# Patient Record
Sex: Female | Born: 1980 | Race: Black or African American | Hispanic: No | Marital: Single | Smoking: Never smoker
Health system: Southern US, Community
[De-identification: ages and names within clinical notes are randomized; demographics above are authoritative.]

## PROBLEM LIST (undated history)

## (undated) DIAGNOSIS — G8929 Other chronic pain: Secondary | ICD-10-CM

## (undated) DIAGNOSIS — F32A Depression, unspecified: Secondary | ICD-10-CM

## (undated) DIAGNOSIS — F419 Anxiety disorder, unspecified: Secondary | ICD-10-CM

## (undated) DIAGNOSIS — R319 Hematuria, unspecified: Secondary | ICD-10-CM

## (undated) DIAGNOSIS — F988 Other specified behavioral and emotional disorders with onset usually occurring in childhood and adolescence: Secondary | ICD-10-CM

## (undated) HISTORY — PX: LAPAROSCOPIC GASTRIC SLEEVE RESECTION: SHX5895

## (undated) HISTORY — PX: OVARIAN CYST REMOVAL: SHX89

## (undated) HISTORY — PX: HERNIA REPAIR: SHX51

---

## 2005-11-10 ENCOUNTER — Encounter: Payer: Self-pay | Admitting: Family Medicine

## 2006-09-25 LAB — CONVERTED CEMR LAB: Pap Smear: NORMAL

## 2006-12-14 ENCOUNTER — Ambulatory Visit: Payer: Self-pay | Admitting: Family Medicine

## 2006-12-14 DIAGNOSIS — M5137 Other intervertebral disc degeneration, lumbosacral region: Secondary | ICD-10-CM

## 2006-12-14 DIAGNOSIS — E669 Obesity, unspecified: Secondary | ICD-10-CM | POA: Insufficient documentation

## 2006-12-14 DIAGNOSIS — I10 Essential (primary) hypertension: Secondary | ICD-10-CM | POA: Insufficient documentation

## 2006-12-18 ENCOUNTER — Ambulatory Visit: Payer: Self-pay | Admitting: Family Medicine

## 2006-12-19 ENCOUNTER — Telehealth: Payer: Self-pay | Admitting: Family Medicine

## 2007-01-03 ENCOUNTER — Telehealth (INDEPENDENT_AMBULATORY_CARE_PROVIDER_SITE_OTHER): Payer: Self-pay | Admitting: *Deleted

## 2007-01-11 ENCOUNTER — Encounter: Payer: Self-pay | Admitting: Family Medicine

## 2007-03-06 ENCOUNTER — Encounter: Payer: Self-pay | Admitting: Family Medicine

## 2007-03-07 ENCOUNTER — Telehealth (INDEPENDENT_AMBULATORY_CARE_PROVIDER_SITE_OTHER): Payer: Self-pay | Admitting: *Deleted

## 2007-10-24 ENCOUNTER — Ambulatory Visit: Payer: Self-pay | Admitting: Family Medicine

## 2007-10-24 ENCOUNTER — Other Ambulatory Visit: Admission: RE | Admit: 2007-10-24 | Discharge: 2007-10-24 | Payer: Self-pay | Admitting: Family Medicine

## 2007-10-24 ENCOUNTER — Encounter: Payer: Self-pay | Admitting: Family Medicine

## 2007-11-16 ENCOUNTER — Ambulatory Visit: Payer: Self-pay | Admitting: Family Medicine

## 2007-12-05 ENCOUNTER — Encounter: Payer: Self-pay | Admitting: Family Medicine

## 2007-12-05 LAB — CONVERTED CEMR LAB
ALT: 13 units/L (ref 0–35)
AST: 16 units/L (ref 0–37)
Alkaline Phosphatase: 58 units/L (ref 39–117)
Creatinine, Ser: 0.89 mg/dL (ref 0.40–1.20)
HCT: 38.9 % (ref 36.0–46.0)
HCV Ab: NEGATIVE
MCHC: 33.4 g/dL (ref 30.0–36.0)
MCV: 82.9 fL (ref 78.0–100.0)
Platelets: 373 10*3/uL (ref 150–400)
RDW: 15.7 % — ABNORMAL HIGH (ref 11.5–15.5)
TSH: 0.955 microintl units/mL (ref 0.350–4.50)
Total Bilirubin: 0.5 mg/dL (ref 0.3–1.2)
Total CHOL/HDL Ratio: 3
VLDL: 12 mg/dL (ref 0–40)

## 2007-12-10 ENCOUNTER — Ambulatory Visit: Payer: Self-pay | Admitting: Family Medicine

## 2007-12-13 ENCOUNTER — Telehealth: Payer: Self-pay | Admitting: Family Medicine

## 2007-12-13 ENCOUNTER — Encounter: Payer: Self-pay | Admitting: Family Medicine

## 2007-12-21 ENCOUNTER — Encounter: Payer: Self-pay | Admitting: Family Medicine

## 2008-01-28 ENCOUNTER — Ambulatory Visit: Payer: Self-pay | Admitting: Family Medicine

## 2008-01-29 ENCOUNTER — Encounter: Payer: Self-pay | Admitting: Family Medicine

## 2008-02-01 LAB — CONVERTED CEMR LAB: Anti Nuclear Antibody(ANA): NEGATIVE

## 2008-02-07 ENCOUNTER — Ambulatory Visit: Payer: Self-pay | Admitting: Family Medicine

## 2008-03-25 ENCOUNTER — Ambulatory Visit: Payer: Self-pay | Admitting: Family Medicine

## 2008-03-25 DIAGNOSIS — N390 Urinary tract infection, site not specified: Secondary | ICD-10-CM

## 2008-03-25 LAB — CONVERTED CEMR LAB
Bilirubin Urine: NEGATIVE
Blood in Urine, dipstick: NEGATIVE
Glucose, Urine, Semiquant: NEGATIVE
Specific Gravity, Urine: 1.02
pH: 6

## 2008-03-26 ENCOUNTER — Encounter: Payer: Self-pay | Admitting: Family Medicine

## 2008-04-01 ENCOUNTER — Encounter: Payer: Self-pay | Admitting: Family Medicine

## 2008-04-01 LAB — CONVERTED CEMR LAB
CO2: 24 meq/L
Calcium: 9.3 mg/dL
Chloride: 106 meq/L
Sodium: 137 meq/L

## 2008-04-14 ENCOUNTER — Encounter: Payer: Self-pay | Admitting: Family Medicine

## 2008-04-30 ENCOUNTER — Ambulatory Visit: Payer: Self-pay | Admitting: Family Medicine

## 2008-05-07 ENCOUNTER — Ambulatory Visit: Payer: Self-pay | Admitting: Family Medicine

## 2008-05-09 ENCOUNTER — Encounter: Payer: Self-pay | Admitting: Family Medicine

## 2008-06-02 ENCOUNTER — Ambulatory Visit: Payer: Self-pay | Admitting: Family Medicine

## 2008-06-02 DIAGNOSIS — J309 Allergic rhinitis, unspecified: Secondary | ICD-10-CM | POA: Insufficient documentation

## 2008-07-25 ENCOUNTER — Ambulatory Visit: Payer: Self-pay | Admitting: Family Medicine

## 2008-07-31 ENCOUNTER — Telehealth (INDEPENDENT_AMBULATORY_CARE_PROVIDER_SITE_OTHER): Payer: Self-pay | Admitting: *Deleted

## 2008-10-16 ENCOUNTER — Ambulatory Visit: Payer: Self-pay | Admitting: Family Medicine

## 2008-10-20 ENCOUNTER — Ambulatory Visit: Payer: Self-pay | Admitting: Family Medicine

## 2008-10-20 LAB — CONVERTED CEMR LAB
Bilirubin Urine: NEGATIVE
Blood in Urine, dipstick: NEGATIVE
Ketones, urine, test strip: NEGATIVE
Nitrite: NEGATIVE
Specific Gravity, Urine: 1.025
pH: 6

## 2008-10-21 ENCOUNTER — Encounter: Payer: Self-pay | Admitting: Family Medicine

## 2008-10-21 LAB — CONVERTED CEMR LAB
Clue Cells Wet Prep HPF POC: NONE SEEN
Trich, Wet Prep: NONE SEEN
Yeast Wet Prep HPF POC: NONE SEEN

## 2008-10-28 ENCOUNTER — Ambulatory Visit: Payer: Self-pay | Admitting: Family Medicine

## 2008-10-28 ENCOUNTER — Other Ambulatory Visit: Admission: RE | Admit: 2008-10-28 | Discharge: 2008-10-28 | Payer: Self-pay | Admitting: Family Medicine

## 2008-10-28 ENCOUNTER — Encounter: Payer: Self-pay | Admitting: Family Medicine

## 2008-10-28 DIAGNOSIS — E282 Polycystic ovarian syndrome: Secondary | ICD-10-CM | POA: Insufficient documentation

## 2008-11-03 ENCOUNTER — Encounter: Payer: Self-pay | Admitting: Family Medicine

## 2008-11-17 ENCOUNTER — Telehealth: Payer: Self-pay | Admitting: Family Medicine

## 2008-11-18 ENCOUNTER — Ambulatory Visit: Payer: Self-pay | Admitting: Family Medicine

## 2008-11-18 DIAGNOSIS — H8110 Benign paroxysmal vertigo, unspecified ear: Secondary | ICD-10-CM

## 2008-11-18 DIAGNOSIS — G43909 Migraine, unspecified, not intractable, without status migrainosus: Secondary | ICD-10-CM | POA: Insufficient documentation

## 2008-12-30 ENCOUNTER — Ambulatory Visit: Payer: Self-pay | Admitting: Family Medicine

## 2009-01-07 ENCOUNTER — Encounter: Payer: Self-pay | Admitting: Family Medicine

## 2009-01-12 ENCOUNTER — Ambulatory Visit: Payer: Self-pay | Admitting: Family Medicine

## 2009-03-02 ENCOUNTER — Ambulatory Visit: Payer: Self-pay | Admitting: Family Medicine

## 2009-04-07 ENCOUNTER — Ambulatory Visit: Payer: Self-pay | Admitting: Family Medicine

## 2010-02-23 NOTE — Assessment & Plan Note (Signed)
Summary: DEPO SHOT  Nurse Visit   Vitals Entered By: Payton Spark CMA (April 07, 2009 8:45 AM)  Allergies: 1)  ! Augmentin 2)  Ultram (Tramadol Hcl)  Medication Administration  Injection # 1:    Medication: Depo-Provera 150mg     Diagnosis: CONTRACEPTIVE MANAGEMENT (ICD-V25.09)    Route: IM    Site: L deltoid    Exp Date: 05/2011    Lot #: [id number]    Patient tolerated injection without complications    Given by: Payton Spark CMA (April 07, 2009 8:47 AM)  Orders Added: 1)  Admin of patients own med IM/SQ [[id number]]   Medication Administration  Injection # 1:    Medication: Depo-Provera 150mg     Diagnosis: CONTRACEPTIVE MANAGEMENT (ICD-V25.09)    Route: IM    Site: L deltoid    Exp Date: 05/2011    Lot #: [id number]    Patient tolerated injection without complications    Given by: Payton Spark CMA (April 07, 2009 8:47 AM)  Orders Added: 1)  Admin of patients own med IM/SQ [phone]

## 2010-02-23 NOTE — Letter (Signed)
Summary: [facility]  [facility]   Imported By: Lanelle Bal 02/26/2009 10:54:24  _____________________________________________________________________  External Attachment:    Type:   Image     Comment:   External Document

## 2010-10-22 DIAGNOSIS — F32A Depression, unspecified: Secondary | ICD-10-CM | POA: Insufficient documentation

## 2011-05-02 DIAGNOSIS — F419 Anxiety disorder, unspecified: Secondary | ICD-10-CM | POA: Insufficient documentation

## 2012-04-19 DIAGNOSIS — G43719 Chronic migraine without aura, intractable, without status migrainosus: Secondary | ICD-10-CM | POA: Insufficient documentation

## 2012-09-18 DIAGNOSIS — L219 Seborrheic dermatitis, unspecified: Secondary | ICD-10-CM | POA: Insufficient documentation

## 2012-10-04 DIAGNOSIS — G4733 Obstructive sleep apnea (adult) (pediatric): Secondary | ICD-10-CM | POA: Insufficient documentation

## 2012-12-06 DIAGNOSIS — F3341 Major depressive disorder, recurrent, in partial remission: Secondary | ICD-10-CM | POA: Insufficient documentation

## 2012-12-06 DIAGNOSIS — Z9884 Bariatric surgery status: Secondary | ICD-10-CM | POA: Insufficient documentation

## 2013-04-01 DIAGNOSIS — M47816 Spondylosis without myelopathy or radiculopathy, lumbar region: Secondary | ICD-10-CM | POA: Insufficient documentation

## 2013-04-01 DIAGNOSIS — G894 Chronic pain syndrome: Secondary | ICD-10-CM | POA: Insufficient documentation

## 2013-06-07 DIAGNOSIS — J302 Other seasonal allergic rhinitis: Secondary | ICD-10-CM | POA: Insufficient documentation

## 2014-06-06 DIAGNOSIS — Z30431 Encounter for routine checking of intrauterine contraceptive device: Secondary | ICD-10-CM | POA: Insufficient documentation

## 2014-08-18 DIAGNOSIS — M25562 Pain in left knee: Secondary | ICD-10-CM | POA: Insufficient documentation

## 2014-08-18 DIAGNOSIS — G8929 Other chronic pain: Secondary | ICD-10-CM | POA: Insufficient documentation

## 2014-08-18 DIAGNOSIS — M533 Sacrococcygeal disorders, not elsewhere classified: Secondary | ICD-10-CM | POA: Insufficient documentation

## 2014-09-04 DIAGNOSIS — M2241 Chondromalacia patellae, right knee: Secondary | ICD-10-CM | POA: Insufficient documentation

## 2015-01-01 DIAGNOSIS — J4530 Mild persistent asthma, uncomplicated: Secondary | ICD-10-CM | POA: Insufficient documentation

## 2015-01-01 DIAGNOSIS — J309 Allergic rhinitis, unspecified: Secondary | ICD-10-CM | POA: Insufficient documentation

## 2015-01-01 DIAGNOSIS — H1013 Acute atopic conjunctivitis, bilateral: Secondary | ICD-10-CM | POA: Insufficient documentation

## 2015-01-02 DIAGNOSIS — N390 Urinary tract infection, site not specified: Secondary | ICD-10-CM | POA: Insufficient documentation

## 2016-09-01 DIAGNOSIS — G8929 Other chronic pain: Secondary | ICD-10-CM | POA: Insufficient documentation

## 2016-09-01 DIAGNOSIS — M5442 Lumbago with sciatica, left side: Secondary | ICD-10-CM | POA: Insufficient documentation

## 2017-04-04 DIAGNOSIS — R7303 Prediabetes: Secondary | ICD-10-CM | POA: Insufficient documentation

## 2018-04-09 ENCOUNTER — Ambulatory Visit: Payer: Managed Care, Other (non HMO) | Admitting: Podiatry

## 2018-04-18 ENCOUNTER — Ambulatory Visit: Payer: Managed Care, Other (non HMO) | Admitting: Podiatry

## 2019-06-05 ENCOUNTER — Ambulatory Visit: Payer: Managed Care, Other (non HMO) | Admitting: Podiatry

## 2019-06-19 ENCOUNTER — Ambulatory Visit (INDEPENDENT_AMBULATORY_CARE_PROVIDER_SITE_OTHER): Payer: Managed Care, Other (non HMO)

## 2019-06-19 ENCOUNTER — Other Ambulatory Visit: Payer: Self-pay | Admitting: Podiatry

## 2019-06-19 ENCOUNTER — Other Ambulatory Visit: Payer: Self-pay

## 2019-06-19 ENCOUNTER — Ambulatory Visit: Payer: Managed Care, Other (non HMO) | Admitting: Podiatry

## 2019-06-19 DIAGNOSIS — M722 Plantar fascial fibromatosis: Secondary | ICD-10-CM

## 2019-06-19 DIAGNOSIS — M7731 Calcaneal spur, right foot: Secondary | ICD-10-CM | POA: Diagnosis not present

## 2019-06-19 DIAGNOSIS — M7732 Calcaneal spur, left foot: Secondary | ICD-10-CM | POA: Diagnosis not present

## 2019-06-21 NOTE — Progress Notes (Signed)
   Subjective: 39 y.o. female presenting today as a new patient, referred by Dr. Elijah Birk, with a chief complaint of burning, sharp, aching pain to the bilateral heels that began about 10 years ago. She has been diagnosed with plantar fasciitis and heel spurs in the past. She reports associated swelling. She has had injection in the past which have helped alleviate her symptoms. Walking and standing increases her pain. Patient is here for further evaluation and treatment.   No past medical history on file.   Objective: Physical Exam General: The patient is alert and oriented x3 in no acute distress.  Dermatology: Skin is warm, dry and supple bilateral lower extremities. Negative for open lesions or macerations bilateral.   Vascular: Dorsalis Pedis and Posterior Tibial pulses palpable bilateral.  Capillary fill time is immediate to all digits.  Neurological: Epicritic and protective threshold intact bilateral.   Musculoskeletal: Tenderness to palpation to the plantar aspect of the bilateral heels along the plantar fascia. All other joints range of motion within normal limits bilateral. Strength 5/5 in all groups bilateral.   Radiographic exam: Normal osseous mineralization. Joint spaces preserved. No fracture/dislocation/boney destruction. No other soft tissue abnormalities or radiopaque foreign bodies.   Assessment: 1. plantar fasciitis bilateral feet  Plan of Care:  1. Patient evaluated. Xrays reviewed.   2. Patient wants surgery in the fall/winter.  3. Recommended shoes from [employer].  4. Continue taking Celebrex for back pain as directed by PCP.  5. Return to clinic in 3 months for surgical consult.   Works at [employer] on [address] all day.   Felecia Shelling, DPM [facility]  Dr. Felecia Shelling, DPM    [address] [place], [place] [address]                Office [phone]  Fax [phone]

## 2019-09-18 ENCOUNTER — Other Ambulatory Visit: Payer: Self-pay

## 2019-09-18 ENCOUNTER — Ambulatory Visit: Payer: Managed Care, Other (non HMO) | Admitting: Podiatry

## 2019-09-18 DIAGNOSIS — M722 Plantar fascial fibromatosis: Secondary | ICD-10-CM

## 2019-09-18 NOTE — Progress Notes (Signed)
   Subjective: 39 y.o. female presenting today for follow-up evaluation and surgical consultation of plantar fasciitis to the bilateral feet is been going on for approximately 10 years now.  She has been working with Dr. Elijah Birk, local podiatrist, for the last few years and she has received all sorts of multiple conservative modalities treatments which have provided minimal relief.  She presents to discuss surgery and move forward with the plantar fasciotomy that was discussed last visit.  No past medical history on file.   Objective: Physical Exam General: The patient is alert and oriented x3 in no acute distress.  Dermatology: Skin is warm, dry and supple bilateral lower extremities. Negative for open lesions or macerations bilateral.   Vascular: Dorsalis Pedis and Posterior Tibial pulses palpable bilateral.  Capillary fill time is immediate to all digits.  Neurological: Epicritic and protective threshold intact bilateral.   Musculoskeletal: Tenderness to palpation to the plantar aspect of the bilateral heels along the plantar fascia. All other joints range of motion within normal limits bilateral. Strength 5/5 in all groups bilateral.   Assessment: 1. plantar fasciitis bilateral feet  Plan of Care:  1. Patient evaluated.  2. 2. Today we discussed the conservative versus surgical management of the presenting pathology. The patient opts for surgical management. All possible complications and details of the procedure were explained. All patient questions were answered. No guarantees were expressed or implied. 3. Authorization for surgery was initiated today. Surgery will consist of endoscopic plantar fasciotomy bilateral 4.  Continue Celebrex from PCP for back pain chronic 5.  Return to clinic 1 week postop  Works at [employer] on [address] all day.   Felecia Shelling, DPM [facility]  Dr. Felecia Shelling, DPM    [address] [place], [place]  [address]                Office [phone]  Fax [phone]

## 2019-09-20 ENCOUNTER — Telehealth: Payer: Self-pay | Admitting: Podiatry

## 2019-09-20 NOTE — Telephone Encounter (Signed)
I'm calling back to choose my sx date. I didn't make it the day of. I want to choose 10/28. You can call me back at [phone]. Thank you.

## 2019-09-20 NOTE — Telephone Encounter (Signed)
Called pt to let her know I have her scheduled for sx on 10/28. Told her she could go ahead and register online via [facility] for the sx center. Told pt that the sx center would call a day before to let her know what time her sx is. Told pt to call with any questions in the meantime.

## 2019-09-24 ENCOUNTER — Other Ambulatory Visit: Payer: Self-pay | Admitting: Physician Assistant

## 2019-09-24 ENCOUNTER — Ambulatory Visit (INDEPENDENT_AMBULATORY_CARE_PROVIDER_SITE_OTHER): Payer: Self-pay

## 2019-09-24 ENCOUNTER — Other Ambulatory Visit: Payer: Self-pay

## 2019-09-24 DIAGNOSIS — M79671 Pain in right foot: Secondary | ICD-10-CM

## 2019-10-09 ENCOUNTER — Other Ambulatory Visit: Payer: Self-pay

## 2019-10-09 ENCOUNTER — Ambulatory Visit (INDEPENDENT_AMBULATORY_CARE_PROVIDER_SITE_OTHER): Payer: Managed Care, Other (non HMO) | Admitting: Podiatry

## 2019-10-09 DIAGNOSIS — M7751 Other enthesopathy of right foot: Secondary | ICD-10-CM | POA: Diagnosis not present

## 2019-10-09 DIAGNOSIS — M722 Plantar fascial fibromatosis: Secondary | ICD-10-CM

## 2019-10-11 ENCOUNTER — Other Ambulatory Visit: Payer: Self-pay

## 2019-10-11 ENCOUNTER — Ambulatory Visit (INDEPENDENT_AMBULATORY_CARE_PROVIDER_SITE_OTHER): Payer: Managed Care, Other (non HMO) | Admitting: Podiatry

## 2019-10-11 ENCOUNTER — Ambulatory Visit (INDEPENDENT_AMBULATORY_CARE_PROVIDER_SITE_OTHER): Payer: Managed Care, Other (non HMO)

## 2019-10-11 DIAGNOSIS — M779 Enthesopathy, unspecified: Secondary | ICD-10-CM | POA: Diagnosis not present

## 2019-10-11 DIAGNOSIS — T148XXA Other injury of unspecified body region, initial encounter: Secondary | ICD-10-CM

## 2019-10-11 DIAGNOSIS — S92901A Unspecified fracture of right foot, initial encounter for closed fracture: Secondary | ICD-10-CM

## 2019-10-11 DIAGNOSIS — M79671 Pain in right foot: Secondary | ICD-10-CM

## 2019-10-11 DIAGNOSIS — S99921D Unspecified injury of right foot, subsequent encounter: Secondary | ICD-10-CM | POA: Diagnosis not present

## 2019-10-11 MED ORDER — MELOXICAM 15 MG PO TABS: 15.0000 mg | ORAL_TABLET | Freq: Every day | ORAL | 0 refills | Status: DC

## 2019-10-11 NOTE — Progress Notes (Signed)
Subjective: 39 year old female presents the office today for concerns of continued pain, swelling to her right foot. She recently just saw Dr. Logan Bores 2 days ago. She states that she is having pain and swelling. Also asking for an increase of meloxicam to 15 mg. Injury started when she was at work in a chair the changes in height in her foot jammed. Her toes did hyperextend. She denies any numbness or tingling to the toes. She presents today wearing surgical shoe. Denies any systemic complaints such as fevers, chills, nausea, vomiting. No acute changes since last appointment, and no other complaints at this time.   Objective: AAO x3, NAD DP/PT pulses palpable bilaterally, CRT less than 3 seconds There is tenderness to the third, fourth, fifth MPJs of the right foot. There is mild edema to the forefoot compared to the contralateral extremity there is no erythema or warmth. Flexor, extensor tendons appear to be intact. MMT 5/5 No pain with calf compression, swelling, warmth, erythema  Assessment: 39 year old female hyperextension injury right forefoot, rule out ligamentous injury  Plan: -All treatment options discussed with the patient including all alternatives, risks, complications.  -Due to the pain and swelling she would like to get an MRI and this is ordered for her today. Prescribe meloxicam 15 mg and discussed side effects. Prescribed elevation and remain in surgical shoe for now. -Patient encouraged to call the office with any questions, concerns, change in symptoms.   Vivi Barrack DPM

## 2019-10-13 NOTE — Progress Notes (Signed)
   Subjective: 39 y.o. female presenting today for new complaint associated to an injury that the patient sustained while at work.  This happened a couple of weeks ago.  DOI: 09/09/2019.  Patient states that she fell off a chair while she was at work and bent her foot backwards.  She went to an urgent care that day where x-rays were taken and were negative for fracture.  She continues to have some pain and throbbing in the toe.  Patient also has a chronic history of plantar fasciitis to the bilateral feet.  She has been treated by Dr. Elijah Birk, local podiatrist for the past several years.  Last visit on 09/18/2018 when she was scheduled for EPF surgery to the bilateral feet.  She currently takes Celebrex from her PCP for chronic back pain.   No past medical history on file.   Objective: Physical Exam General: The patient is alert and oriented x3 in no acute distress.  Dermatology: Skin is warm, dry and supple bilateral lower extremities. Negative for open lesions or macerations bilateral.   Vascular: Dorsalis Pedis and Posterior Tibial pulses palpable bilateral.  Capillary fill time is immediate to all digits.  Neurological: Epicritic and protective threshold intact bilateral.   Musculoskeletal: Tenderness to palpation to the plantar aspect of the bilateral heels along the plantar fascia. All other joints range of motion within normal limits bilateral. Strength 5/5 in all groups bilateral.  There is some pain on palpation range of motion of the first MTPJ of the right foot.  Likely secondary to hyper dorsiflexion injury that was sustained when the patient fell out of the chair while working.  Assessment: 1. plantar fasciitis bilateral feet 2.  First MTPJ capsulitis/hyper dorsiflexion injury right  Plan of Care:  1. Patient evaluated.  2.  Currently the patient is scheduled for EPF surgery bilateral on 10/22/2019.   3.  In regards to the injury of the right forefoot, recommend postoperative shoe.   Postoperative shoe was dispensed today.  Weightbearing as tolerated  4.  Continue Celebrex from PCP for back pain chronic 5.  Return to clinic 1 week postop  Works at [employer] on [address] all day.   Felecia Shelling, DPM [facility]  Dr. Felecia Shelling, DPM    [address] [place], [place] [address]                Office [phone]  Fax [phone]

## 2019-10-16 ENCOUNTER — Encounter: Payer: Self-pay | Admitting: Podiatry

## 2019-10-22 ENCOUNTER — Other Ambulatory Visit: Payer: 59

## 2019-10-28 ENCOUNTER — Encounter: Payer: Self-pay | Admitting: Podiatry

## 2019-10-29 ENCOUNTER — Telehealth: Payer: Self-pay

## 2019-10-29 NOTE — Telephone Encounter (Addendum)
DOS 12/26/2019  EPF B/L - 20947  [employer] EFFECTIVE DATE - 05/24/2016  PLAN DEDUCTIBLE - $750.00 W/ $750.00 MET OUT OF POCKET - $2500.00 W/ $0962.83 MET COPAY $0.00 COINSURANCE - 90%  PER AUTOMATED SYSTEM NO PRECERT REQUIRED FOR CPT [phone]. CONF # [id number]

## 2019-10-30 ENCOUNTER — Other Ambulatory Visit: Payer: Self-pay | Admitting: Podiatry

## 2019-10-30 NOTE — Progress Notes (Unsigned)
Called and scheduled peer to peer for 10:45am today to start the reconsideration process.   60630 CPT code is for the foot.

## 2019-11-01 ENCOUNTER — Other Ambulatory Visit: Payer: Self-pay | Admitting: Podiatry

## 2019-11-01 ENCOUNTER — Telehealth: Payer: Self-pay | Admitting: Podiatry

## 2019-11-01 NOTE — Telephone Encounter (Signed)
Called [employer] and the peer to peer was cancelled on Wednesday on their end.  I have a new call scheduled for Monday at 10am. It will be with Dr. Marchelle Gearing.

## 2019-11-04 ENCOUNTER — Telehealth: Payer: Self-pay | Admitting: Podiatry

## 2019-11-04 NOTE — Telephone Encounter (Signed)
Peer to peer completed and CPT code was updated to 73718 Authorization number is [id number]

## 2019-11-09 ENCOUNTER — Ambulatory Visit
Admission: RE | Admit: 2019-11-09 | Discharge: 2019-11-09 | Disposition: A | Discharge status: Home / Self Care | Payer: 59 | Source: Ambulatory Visit | Attending: Podiatry | Admitting: Podiatry

## 2019-11-09 ENCOUNTER — Other Ambulatory Visit: Payer: Self-pay

## 2019-11-09 DIAGNOSIS — T148XXA Other injury of unspecified body region, initial encounter: Secondary | ICD-10-CM

## 2019-11-12 ENCOUNTER — Encounter: Payer: Self-pay | Admitting: Podiatry

## 2019-11-14 DIAGNOSIS — F322 Major depressive disorder, single episode, severe without psychotic features: Secondary | ICD-10-CM | POA: Insufficient documentation

## 2019-11-18 ENCOUNTER — Other Ambulatory Visit: Payer: Self-pay

## 2019-11-18 ENCOUNTER — Ambulatory Visit: Payer: Managed Care, Other (non HMO) | Admitting: Podiatry

## 2019-11-18 DIAGNOSIS — M7751 Other enthesopathy of right foot: Secondary | ICD-10-CM | POA: Diagnosis not present

## 2019-11-18 DIAGNOSIS — M722 Plantar fascial fibromatosis: Secondary | ICD-10-CM

## 2019-11-18 NOTE — Progress Notes (Signed)
   HPI: 39 y.o. female presenting today for follow-up evaluation of first MTPJ capsulitis of her right foot secondary to hyper dorsiflexion injury.  DOI: 09/09/2019.  Patient sustained the injury while at work.  She states that while she is wearing the postsurgical shoe she feels some relief however as soon as she gets out of the shoe into sneakers she is having pain and tenderness.  Currently she is scheduled for EPF surgery bilateral this Thursday, 11/21/2019.  Today she is hoping to have some relief and perhaps an injection to the right forefoot to alleviate her forefoot pain.  No past medical history on file.   Physical Exam: General: The patient is alert and oriented x3 in no acute distress.  Dermatology: Skin is warm, dry and supple bilateral lower extremities. Negative for open lesions or macerations.  Vascular: Palpable pedal pulses bilaterally. No edema or erythema noted. Capillary refill within normal limits.  Neurological: Epicritic and protective threshold grossly intact bilaterally.   Musculoskeletal Exam: Range of motion within normal limits to all pedal and ankle joints bilateral. Muscle strength 5/5 in all groups bilateral.  Pain on palpation to the medial calcaneal tubercle bilateral heels.  There is also pain on palpation with range of motion of the second third MTPJ of the right forefoot  MRI impression 11/09/2019: 1. Trace fluid signal intensity between the heads of the first, second, third, and fourth metatarsals potentially representing mild intermetatarsal bursitis. 2. Low-level edema in the plantar portion of the extensor digitorum brevis muscle, query muscle strain. 3. Mild degenerative findings in the midfoot and forefoot. 4. Despite efforts by the technologist and patient, motion artifact is present on today's exam and could not be eliminated. This reduces exam sensitivity and specificity.  Assessment: 1.  Metatarsophalangeal capsulitis right forefoot 2.   Chronic plantar fasciitis bilateral   Plan of Care:  1. Patient evaluated.  MRI reviewed.  2.  Injection of 0.5 cc Celestone Soluspan injected into the second and third MTPJ right forefoot 3.  Patient scheduled for EPF surgery bilateral on 11/21/2019 4.  Patient is no longer taking Celebrex from her PCP.  Continue meloxicam as prescribed 5.  Return to clinic 1 week postop  *Works at [employer].  Patient states that she is taking 3 months off for surgery      Felecia Shelling, DPM [facility]  Dr. Felecia Shelling, DPM    [address] [place], [place] [address]                Office [phone]  Fax [phone]

## 2019-11-27 ENCOUNTER — Encounter: Payer: Managed Care, Other (non HMO) | Admitting: Podiatry

## 2019-12-04 ENCOUNTER — Other Ambulatory Visit: Payer: Self-pay | Admitting: Podiatry

## 2019-12-04 ENCOUNTER — Encounter: Payer: Managed Care, Other (non HMO) | Admitting: Podiatry

## 2019-12-04 NOTE — Telephone Encounter (Signed)
Please advise 

## 2019-12-18 ENCOUNTER — Encounter: Payer: Managed Care, Other (non HMO) | Admitting: Podiatry

## 2019-12-26 ENCOUNTER — Other Ambulatory Visit: Payer: Self-pay | Admitting: Podiatry

## 2019-12-26 DIAGNOSIS — M722 Plantar fascial fibromatosis: Secondary | ICD-10-CM | POA: Diagnosis not present

## 2019-12-26 MED ORDER — OXYCODONE-ACETAMINOPHEN 5-325 MG PO TABS: 1.0000 | ORAL_TABLET | ORAL | 0 refills | Status: DC | PRN

## 2019-12-26 NOTE — Progress Notes (Signed)
PRN postop 

## 2020-01-01 ENCOUNTER — Ambulatory Visit (INDEPENDENT_AMBULATORY_CARE_PROVIDER_SITE_OTHER): Payer: Managed Care, Other (non HMO) | Admitting: Podiatry

## 2020-01-01 ENCOUNTER — Other Ambulatory Visit: Payer: Self-pay

## 2020-01-01 DIAGNOSIS — M722 Plantar fascial fibromatosis: Secondary | ICD-10-CM

## 2020-01-01 DIAGNOSIS — M7751 Other enthesopathy of right foot: Secondary | ICD-10-CM

## 2020-01-01 DIAGNOSIS — Z9889 Other specified postprocedural states: Secondary | ICD-10-CM

## 2020-01-01 NOTE — Progress Notes (Signed)
   Subjective:  Patient presents today status post EPF bilateral. DOS: 12/27/2019.  Patient states she is doing well.  She did notice a little bit of bleeding through the bandage.  Otherwise no new complaints and the pain is somewhat tolerable.  She is weightbearing as tolerated in the postsurgical shoes.  No past medical history on file.    Objective/Physical Exam Neurovascular status intact.  Skin incisions appear to be well coapted with sutures intact. No sign of infectious process noted. No dehiscence. No active bleeding noted. Moderate edema noted to the surgical extremity.  Assessment: 1. s/p EPF bilateral. DOS: 12/26/2019   Plan of Care:  1. Patient was evaluated.  2.  Dressings changed today.  Recommend antibiotic ointment and a Band-Aid daily 3.  Ace wrap applied.  Recommend Ace wrap daily 4.  Return to clinic in 1 week for suture removal  *Works at [employer].  Patient states that she is taking 3 months off for surgery   Felecia Shelling, DPM [facility]  Dr. Felecia Shelling, DPM    [address] [place], [place] [address]                Office [phone]  Fax [phone]

## 2020-01-05 ENCOUNTER — Other Ambulatory Visit: Payer: Self-pay | Admitting: Podiatry

## 2020-01-06 NOTE — Telephone Encounter (Signed)
Please advise 

## 2020-01-08 ENCOUNTER — Ambulatory Visit (INDEPENDENT_AMBULATORY_CARE_PROVIDER_SITE_OTHER): Payer: Managed Care, Other (non HMO) | Admitting: Podiatry

## 2020-01-08 ENCOUNTER — Encounter: Payer: Self-pay | Admitting: Podiatry

## 2020-01-08 ENCOUNTER — Other Ambulatory Visit: Payer: Self-pay

## 2020-01-08 DIAGNOSIS — Z9889 Other specified postprocedural states: Secondary | ICD-10-CM

## 2020-01-08 DIAGNOSIS — M722 Plantar fascial fibromatosis: Secondary | ICD-10-CM

## 2020-01-22 NOTE — Progress Notes (Signed)
   Subjective:  Patient presents today status post EPF bilateral. DOS: 12/27/2019.  Patient states that she is doing well.  She continues to have some moderate pain throughout the day.  Alleviated by rest and elevating feet.  No new complaints at this time No past medical history on file.    Objective/Physical Exam Neurovascular status intact.  Skin incisions appear to be well coapted with sutures intact. No sign of infectious process noted. No dehiscence. No active bleeding noted. Moderate edema noted to the surgical extremity.  Assessment: 1. s/p EPF bilateral. DOS: 12/26/2019   Plan of Care:  1. Patient was evaluated.  2.  Sutures removed today. 3.  Patient may transition out of the postoperative shoes into good supportive sneakers 4.  Compression ankle sleeve dispensed.  Wear daily 5.  Return to clinic in 4 weeks  *Works at [employer].  Patient states that she is taking 3 months off for surgery   Felecia Shelling, DPM [facility]  Dr. Felecia Shelling, DPM    [address] [place], [place] [address]                Office [phone]  Fax [phone]

## 2020-01-27 ENCOUNTER — Encounter: Payer: Managed Care, Other (non HMO) | Admitting: Podiatry

## 2020-01-29 ENCOUNTER — Ambulatory Visit (INDEPENDENT_AMBULATORY_CARE_PROVIDER_SITE_OTHER): Payer: Managed Care, Other (non HMO) | Admitting: Podiatry

## 2020-01-29 ENCOUNTER — Other Ambulatory Visit: Payer: Self-pay

## 2020-01-29 DIAGNOSIS — Z9889 Other specified postprocedural states: Secondary | ICD-10-CM

## 2020-01-29 DIAGNOSIS — M722 Plantar fascial fibromatosis: Secondary | ICD-10-CM

## 2020-01-29 NOTE — Progress Notes (Signed)
   Subjective:  Patient presents today status post EPF bilateral. DOS: 12/27/2019.  Patient states that she is doing well.  Patient does have some achiness throughout the day.  Otherwise no new complaints at this time   No past medical history on file.    Objective/Physical Exam Neurovascular status intact.  Skin incisions appear to be well coapted and healed. No sign of infectious process noted. No dehiscence. No active bleeding noted.  Negative for any significant edema noted to the surgical extremity.  Assessment: 1. s/p EPF bilateral. DOS: 12/26/2019   Plan of Care:  1. Patient was evaluated.  2.  Patient may slowly increase activity daily.  Continue to refrain from work additional 2 months.  Projected return to work date 03/26/2020. 3.  Continue compression ankle sleeves and good supportive shoes daily 4.  Return to clinic 2 months for follow-up evaluation just prior to returning to work  *Works at [employer].  Patient states that she is taking 3 months off for surgery   Felecia Shelling, DPM [facility]  Dr. Felecia Shelling, DPM    [address] [place], [place] [address]                Office [phone]  Fax [phone]

## 2020-02-02 ENCOUNTER — Other Ambulatory Visit: Payer: Self-pay | Admitting: Podiatry

## 2020-02-02 NOTE — Telephone Encounter (Signed)
Please advise 

## 2020-03-06 ENCOUNTER — Other Ambulatory Visit: Payer: Self-pay | Admitting: Podiatry

## 2020-03-23 ENCOUNTER — Encounter: Payer: Self-pay | Admitting: Podiatry

## 2020-03-23 ENCOUNTER — Ambulatory Visit (INDEPENDENT_AMBULATORY_CARE_PROVIDER_SITE_OTHER): Payer: Managed Care, Other (non HMO) | Admitting: Podiatry

## 2020-03-23 ENCOUNTER — Other Ambulatory Visit: Payer: Self-pay

## 2020-03-23 DIAGNOSIS — M722 Plantar fascial fibromatosis: Secondary | ICD-10-CM

## 2020-03-23 DIAGNOSIS — Z9889 Other specified postprocedural states: Secondary | ICD-10-CM

## 2020-03-23 NOTE — Addendum Note (Signed)
Addended by: Francesca Oman on: 03/23/2020 11:57 AM   Modules accepted: Orders

## 2020-03-23 NOTE — Progress Notes (Signed)
   Subjective:  Patient presents today status post EPF bilateral. DOS: 12/27/2019.  Patient states that she is doing well.  She continues to have pain to her heels when she is on her feet for an extended period of time.  She is concerned since she is post to return to work beginning 03/26/2020.  She has been wearing good supportive shoes as instructed.   No past medical history on file.    Objective/Physical Exam Neurovascular status intact.  Skin incisions appear to be well coapted and healed. No sign of infectious process noted. No dehiscence. No active bleeding noted.  Negative for any significant edema noted to the surgical extremity.  There continues to be some achiness and tenderness palpation at the medial calcaneal tubercle bilateral  Assessment: 1. s/p EPF bilateral. DOS: 12/26/2019   Plan of Care:  1. Patient was evaluated.  2.  Overall the patient does have some improvement however she continues to have some pain and tenderness. 3.  Order placed for physical therapy at [facility] 4.  Continue wearing good supportive shoes 5.  Note for work was provided today.  Half shift only x4 weeks.  Rest as needed.  No extended periods of walking or standing 6.  Return to clinic in 6 weeks  *Works at [employer].  Patient states that she is taking 3 months off for surgery   Felecia Shelling, DPM [facility]  Dr. Felecia Shelling, DPM    [address] [place], [place] [address]                Office [phone]  Fax [phone]

## 2020-04-24 ENCOUNTER — Encounter: Payer: Self-pay | Admitting: Podiatry

## 2020-05-04 ENCOUNTER — Telehealth: Payer: Self-pay | Admitting: Podiatry

## 2020-05-04 ENCOUNTER — Other Ambulatory Visit: Payer: Self-pay

## 2020-05-04 ENCOUNTER — Ambulatory Visit (INDEPENDENT_AMBULATORY_CARE_PROVIDER_SITE_OTHER): Payer: Managed Care, Other (non HMO) | Admitting: Podiatry

## 2020-05-04 DIAGNOSIS — Z9889 Other specified postprocedural states: Secondary | ICD-10-CM

## 2020-05-04 DIAGNOSIS — M722 Plantar fascial fibromatosis: Secondary | ICD-10-CM

## 2020-05-04 MED ORDER — TRAMADOL HCL 50 MG PO TABS: 50.0000 mg | ORAL_TABLET | Freq: Four times a day (QID) | ORAL | 0 refills | Status: AC | PRN

## 2020-05-04 NOTE — Telephone Encounter (Signed)
error 

## 2020-05-04 NOTE — Telephone Encounter (Signed)
Patient called to inquire is she is ok to return for full time at work or stay part time duty. Please advise.

## 2020-05-04 NOTE — Progress Notes (Signed)
   Subjective:  Patient presents today status post EPF bilateral. DOS: 12/27/2019.  Patient states that she is doing well.  Patient states that overall there is been some improvement however she continues to have some pain specifically to the left foot.  She has been doing physical therapy 2 times per week no new complaints at this time   No past medical history on file.    Objective/Physical Exam Neurovascular status intact.  Skin incisions appear to be well coapted and healed. No sign of infectious process noted. No dehiscence. No active bleeding noted.  Negative for any significant edema noted to the surgical extremity.  There continues to be some achiness and tenderness palpation at the medial calcaneal tubercle bilateral left greater than on the right  Assessment: 1. s/p EPF bilateral. DOS: 12/26/2019   Plan of Care:  1. Patient was evaluated.  2.  Overall the patient continues to improve.  She states that the physical therapy continues to help. 3.  Continue physical therapy at [facility] 2 times per week 4.  Prescription for tramadol 50 mg every 6 hours #30 as needed intermittent pain 5.  OTC power step insoles provided.  Wear daily in her new balance shoes 6.  Return to clinic in 2 months  *Works at [employer].  Patient states that she is taking 3 months off for surgery   Felecia Shelling, DPM [facility]  Dr. Felecia Shelling, DPM    [address] [place], [place] [address]                Office [phone]  Fax [phone]

## 2020-05-11 ENCOUNTER — Encounter: Payer: Self-pay | Admitting: Podiatry

## 2020-05-26 ENCOUNTER — Telehealth: Payer: Self-pay | Admitting: *Deleted

## 2020-05-26 NOTE — Telephone Encounter (Signed)
Patient is calling with issues of B/ l feet swelling after Physical Therapy a few days ago. She tried to flatten her foot in her crocks after therapy but could not. The tramadol prescribed does not help, had to take medicine from surgery (12/27/19).  Please schedule sooner appointment for reevaluation.

## 2020-05-27 NOTE — Telephone Encounter (Signed)
Patient scheduled 06/01/20 @ 3:45.

## 2020-06-01 ENCOUNTER — Other Ambulatory Visit: Payer: Self-pay

## 2020-06-01 ENCOUNTER — Ambulatory Visit (INDEPENDENT_AMBULATORY_CARE_PROVIDER_SITE_OTHER): Payer: Managed Care, Other (non HMO) | Admitting: Podiatry

## 2020-06-01 ENCOUNTER — Encounter: Payer: Self-pay | Admitting: Podiatry

## 2020-06-01 DIAGNOSIS — M722 Plantar fascial fibromatosis: Secondary | ICD-10-CM | POA: Diagnosis not present

## 2020-06-15 ENCOUNTER — Other Ambulatory Visit: Payer: Self-pay

## 2020-06-15 ENCOUNTER — Ambulatory Visit: Payer: Managed Care, Other (non HMO) | Admitting: Podiatry

## 2020-06-15 DIAGNOSIS — L600 Ingrowing nail: Secondary | ICD-10-CM

## 2020-06-15 MED ORDER — GENTAMICIN SULFATE 0.1 % EX CREA: 1.0000 "application " | TOPICAL_CREAM | Freq: Two times a day (BID) | CUTANEOUS | 1 refills | Status: DC

## 2020-06-15 MED ORDER — DOXYCYCLINE HYCLATE 100 MG PO TABS: 100.0000 mg | ORAL_TABLET | Freq: Two times a day (BID) | ORAL | 0 refills | Status: DC

## 2020-06-15 NOTE — Progress Notes (Signed)
   Subjective: Patient presents today for evaluation of pain to the lateral border of the RT great toe. Patient is concerned for possible ingrown nail. Patient presents today for further treatment and evaluation.  No past medical history on file.  Objective:  General: Well developed, nourished, in no acute distress, alert and oriented x3   Dermatology: Skin is warm, dry and supple bilateral. Lateral border of the RT great toe appears to be erythematous with evidence of an ingrowing nail. Pain on palpation noted to the border of the nail fold. The remaining nails appear unremarkable at this time. There are no open sores, lesions.  Vascular: Dorsalis Pedis artery and Posterior Tibial artery pedal pulses palpable. No lower extremity edema noted.   Neruologic: Grossly intact via light touch bilateral.  Musculoskeletal: Muscular strength within normal limits in all groups bilateral. Normal range of motion noted to all pedal and ankle joints.   Assesement: #1 Paronychia with ingrowing nail RT great toe lateral border #2 Pain in toe #3 Incurvated nail  Plan of Care:  1. Patient evaluated.  2. Discussed treatment alternatives and plan of care. Explained nail avulsion procedure and post procedure course to patient. 3. Patient opted for conservative debridement of the offending nail border using a nail nipper 4. Rx gentamicin cream 5. Rx doxycycline 100mg  #14 6. RTC 1 week, if she still has pain and sensitivity we will proceed with partial permanent nail avulsion RT   *works at .   [employer], DPM [facility]  Dr. Felecia Shelling, DPM    [address]                                        [place], [place]                Office [phone]  Fax [phone]

## 2020-06-17 NOTE — Progress Notes (Signed)
   Subjective:  Patient presents today status post EPF bilateral. DOS: 12/27/2019.  Patient states that overall she is doing well.  She says that some days are better than others.  She continues to go to physical therapy.  She states that it helped significantly   History reviewed. No pertinent past medical history.    Objective/Physical Exam Neurovascular status intact.  Skin incisions appear to be well coapted and healed. No sign of infectious process noted. No dehiscence. No active bleeding noted.  Negative for any significant edema noted to the surgical extremity.  Minimal achiness and tenderness palpation at the medial calcaneal tubercle bilateral left greater than on the right  Assessment: 1. s/p EPF bilateral. DOS: 12/26/2019   Plan of Care:  1. Patient was evaluated.  2.  Patient may now resume full activity no restrictions beginning 07/13/2020. 3.  Continue physical therapy at [facility] until completed 4.  We will extend the patient's work restrictions to 07/13/2020 5.  Return to clinic as needed  *Works at [employer].  Patient states that she is taking 3 months off for surgery   Felecia Shelling, DPM [facility]  Dr. Felecia Shelling, DPM    [address] [place], [place] [address]                Office [phone]  Fax [phone]

## 2020-06-24 ENCOUNTER — Other Ambulatory Visit: Payer: Self-pay

## 2020-06-24 ENCOUNTER — Ambulatory Visit: Payer: Managed Care, Other (non HMO) | Admitting: Podiatry

## 2020-06-24 DIAGNOSIS — M7752 Other enthesopathy of left foot: Secondary | ICD-10-CM

## 2020-06-24 DIAGNOSIS — L600 Ingrowing nail: Secondary | ICD-10-CM | POA: Diagnosis not present

## 2020-06-24 MED ORDER — FLUCONAZOLE 150 MG PO TABS: 150.0000 mg | ORAL_TABLET | Freq: Once | ORAL | 1 refills | Status: AC

## 2020-06-25 ENCOUNTER — Telehealth: Payer: Self-pay | Admitting: Podiatry

## 2020-06-25 NOTE — Telephone Encounter (Signed)
Patient called our office stating that she was seen 06/24/20 and was offered to be put on a steroid pack and states she has changed her mind and would like she be on the steroid pack as well as  meloxicam.

## 2020-06-25 NOTE — Telephone Encounter (Signed)
Pt called back about the refill on the steroid pack and Meloxicam, please advise

## 2020-06-26 ENCOUNTER — Other Ambulatory Visit: Payer: Self-pay | Admitting: Podiatry

## 2020-06-26 ENCOUNTER — Other Ambulatory Visit: Payer: Self-pay

## 2020-06-26 ENCOUNTER — Telehealth: Payer: Self-pay | Admitting: Podiatry

## 2020-06-26 MED ORDER — MELOXICAM 15 MG PO TABS
15.0000 mg | ORAL_TABLET | Freq: Every day | ORAL | 1 refills | Status: DC
Start: 2020-06-26 — End: 2020-09-21

## 2020-06-26 MED ORDER — METHYLPREDNISOLONE 4 MG PO TBPK: ORAL_TABLET | ORAL | 0 refills | Status: DC

## 2020-06-26 NOTE — Telephone Encounter (Signed)
Patient called back wanting a prescription for Meloxicam and a steroid pack.. please advise

## 2020-06-26 NOTE — Telephone Encounter (Signed)
Pt called again wanting an update on her medication. Please advise.  

## 2020-07-03 ENCOUNTER — Other Ambulatory Visit: Payer: Self-pay

## 2020-07-03 ENCOUNTER — Encounter: Payer: Self-pay | Admitting: Emergency Medicine

## 2020-07-03 ENCOUNTER — Emergency Department
Admission: EM | Admit: 2020-07-03 | Discharge: 2020-07-03 | Disposition: A | Discharge status: Home / Self Care | Payer: Managed Care, Other (non HMO) | Source: Home / Self Care | Attending: Family Medicine | Admitting: Family Medicine

## 2020-07-03 DIAGNOSIS — N3000 Acute cystitis without hematuria: Secondary | ICD-10-CM | POA: Diagnosis not present

## 2020-07-03 DIAGNOSIS — R300 Dysuria: Secondary | ICD-10-CM

## 2020-07-03 HISTORY — DX: Other chronic pain: G89.29

## 2020-07-03 HISTORY — DX: Other specified behavioral and emotional disorders with onset usually occurring in childhood and adolescence: F98.8

## 2020-07-03 LAB — POCT URINALYSIS DIP (MANUAL ENTRY)
Bilirubin, UA: NEGATIVE
Bilirubin, UA: NEGATIVE
Blood, UA: NEGATIVE
Blood, UA: NEGATIVE
Glucose, UA: NEGATIVE mg/dL
Glucose, UA: NEGATIVE mg/dL
Ketones, POC UA: NEGATIVE mg/dL
Ketones, POC UA: NEGATIVE mg/dL
Nitrite, UA: POSITIVE — AB
Nitrite, UA: POSITIVE — AB
Spec Grav, UA: >=1.03 — AB (ref 1.010–1.025)
Spec Grav, UA: >=1.03 — AB (ref 1.010–1.025)
Urobilinogen, UA: 0.2 E.U./dL
Urobilinogen, UA: 0.2 E.U./dL
pH, UA: 6 (ref 5.0–8.0)
pH, UA: 6 (ref 5.0–8.0)

## 2020-07-03 MED ORDER — SULFAMETHOXAZOLE-TRIMETHOPRIM 800-160 MG PO TABS: 1.0000 | ORAL_TABLET | Freq: Two times a day (BID) | ORAL | 0 refills | Status: DC

## 2020-07-03 MED ORDER — ONDANSETRON 4 MG PO TBDP
4.0000 mg | ORAL_TABLET | Freq: Once | ORAL | Status: AC
  Administered 2020-07-03: 4 mg via ORAL

## 2020-07-03 MED ORDER — FLUCONAZOLE 150 MG PO TABS: ORAL_TABLET | ORAL | 0 refills | Status: DC

## 2020-07-03 NOTE — ED Provider Notes (Signed)
[facility]    CSN: [id number] Arrival date & time: 07/03/20  1728      History   Chief Complaint Chief Complaint  Patient presents with   Dysuria    HPI [facility] is a 40 y.o. female.   Patient noticed malodorous urine about a week ago, and has gradually developed dysuria and frequency.  She denies abdominal/pelvic pain and fever.  No LMP recorded. Patient has had an DepoProvera.   The history is provided by the patient.  Dysuria Pain quality:  Burning Pain severity:  Mild Onset quality:  Gradual Duration:  1 week Timing:  Constant Progression:  Worsening Chronicity:  New Recent urinary tract infections: no   Relieved by:  Nothing Worsened by:  Nothing Ineffective treatments:  None tried Urinary symptoms: foul-smelling urine, frequent urination and hesitancy   Urinary symptoms: no discolored urine, no hematuria and no bladder incontinence   Associated symptoms: no abdominal pain, no fever, no flank pain, no genital lesions, no nausea, no vaginal discharge and no vomiting    Past Medical History:  Diagnosis Date   ADD (attention deficit disorder)    Chronic pain     Patient Active Problem List   Diagnosis Date Noted   MIGRAINE HEADACHE 11/18/2008   BENIGN POSITIONAL VERTIGO 11/18/2008   POLYCYSTIC OVARIES 10/28/2008   ALLERGIC RHINITIS CAUSE UNSPECIFIED 06/02/2008   URINARY TRACT INFECTION, RECURRENT 03/25/2008   OBESITY, UNSPECIFIED 12/14/2006   ESSENTIAL HYPERTENSION, BENIGN 12/14/2006   DISC DISEASE, LUMBAR 12/14/2006    History reviewed. No pertinent surgical history.  OB History   No obstetric history on file.      Home Medications    Prior to Admission medications   Medication Sig Start Date End Date Taking? Authorizing Provider  sulfamethoxazole-trimethoprim (BACTRIM DS) 800-160 MG tablet Take 1 tablet by mouth 2 (two) times daily. 07/03/20  Yes Lattie Haw, MD  acetaminophen (TYLENOL) 500 MG tablet Take by mouth.  12/06/18   [provider]  albuterol (VENTOLIN HFA) 108 (90 Base) MCG/ACT inhaler Inhale into the lungs. 07/21/13   [provider]  azelastine (ASTELIN) 0.1 % nasal spray SMARTSIG:2 Spray(s) Both Nares Twice Daily PRN 06/15/19   [provider]  azelastine (ASTELIN) 0.1 % nasal spray 2 sprays 2 (two) times daily. 07/13/19   [provider]  buprenorphine (BUTRANS) 7.5 MCG/HR 1 patch once a week. 06/02/20   [provider]  buPROPion (WELLBUTRIN XL) 300 MG 24 hr tablet Take 300 mg by mouth daily. 06/16/19   [provider]  celecoxib (CELEBREX) 200 MG capsule Take by mouth 2 (two) times daily. 06/14/19   [provider]  desvenlafaxine (PRISTIQ) 100 MG 24 hr tablet Take 100 mg by mouth daily. 03/13/19   [provider]  doxycycline (VIBRA-TABS) 100 MG tablet Take 1 tablet (100 mg total) by mouth 2 (two) times daily. 06/15/20   Felecia Shelling, DPM  fluconazole (DIFLUCAN) 150 MG tablet TAKE ONE TABLET (150 MG DOSE) BY MOUTH ONCE FOR 1 DOSE. REPEAT IN 72 HOURS IF NEEDED 07/03/20   Lattie Haw, MD  fluticasone Aleda Grana) 50 MCG/ACT nasal spray  04/18/19   [provider]  gentamicin cream (GARAMYCIN) 0.1 % Apply 1 application topically 2 (two) times daily. 06/15/20   Felecia Shelling, DPM  ibuprofen (ADVIL) 800 MG tablet Take 1 tablet every 8 hours as needed for pain 12/06/18   [provider]  ipratropium (ATROVENT) 0.03 % nasal spray  04/18/19  [provider]  lisdexamfetamine (VYVANSE) 30 MG capsule Take by mouth. 07/08/19   [provider]  lisinopril (ZESTRIL) 40 MG tablet Take 40 mg by mouth daily. 04/22/19   [provider]  lisinopril (ZESTRIL) 40 MG tablet SMARTSIG:1 Tablet(s) By Mouth Daily 07/12/19   [provider]  medroxyPROGESTERone (DEPO-PROVERA) 150 MG/ML injection Inject into the muscle. 02/16/17   [provider]  meloxicam (MOBIC) 15 MG tablet Take 1 tablet (15  mg total) by mouth daily. 06/26/20   Felecia Shelling, DPM  methylPREDNISolone (MEDROL DOSEPAK) 4 MG TBPK tablet 6 day dose pack - take as directed 06/26/20   Felecia Shelling, DPM  metoprolol tartrate (LOPRESSOR) 25 MG tablet Take 25 mg by mouth 2 (two) times daily. 02/13/20   [provider]  mirtazapine (REMERON SOL-TAB) 15 MG disintegrating tablet Take by mouth. 11/28/18   [provider]  montelukast (SINGULAIR) 10 MG tablet Take 10 mg by mouth daily. 04/22/19   [provider]  nadolol (CORGARD) 80 MG tablet TAKE ONE TABLET BY MOUTH DAILY 04/16/19   [provider]  naloxone (NARCAN) nasal spray 4 mg/0.1 mL Place into the nose. 08/13/19   [provider]  OXcarbazepine (TRILEPTAL) 150 MG tablet Take 150 mg by mouth at bedtime. 06/11/19   [provider]  oxyCODONE-acetaminophen (PERCOCET) 5-325 MG tablet Take 1 tablet by mouth every 4 (four) hours as needed for severe pain. 12/26/19   Felecia Shelling, DPM  pregabalin (LYRICA) 200 MG capsule Take 200 mg by mouth 3 (three) times daily. 06/14/19   [provider]  REXULTI 0.5 MG TABS Take 1 tablet by mouth daily. 03/13/19   [provider]  tizanidine (ZANAFLEX) 2 MG capsule  06/14/19   [provider]  traZODone (DESYREL) 50 MG tablet TAKE 1 TABLET BY MOUTH  NIGHTLY AS NEEDED FOR SLEEP 09/26/17   [provider]  Vilazodone HCl (VIIBRYD) 20 MG TABS Take by mouth. 07/25/18   [provider]  VYVANSE 20 MG capsule Take 20 mg by mouth every morning. 06/06/19   [provider]    Family History No family history on file.  Social History     Allergies   Dust mite extract, Molds & smuts, Other, Quercus robur, Tramadol, Amoxicillin-pot clavulanate, Tramadol hcl, Bee pollen, and Pollen extract   Review of Systems Review of Systems  Constitutional:  Negative for activity change, appetite change, chills, diaphoresis, fatigue and fever.  Gastrointestinal:   Negative for abdominal distention, abdominal pain, nausea and vomiting.  Genitourinary:  Positive for dysuria, frequency and urgency. Negative for flank pain, hematuria, menstrual problem, pelvic pain, vaginal bleeding and vaginal discharge.  Neurological:  Negative for headaches.  Hematological:  Negative for adenopathy.  All other systems reviewed and are negative.   Physical Exam Triage Vital Signs ED Triage Vitals  Enc Vitals Group     BP 07/03/20 1825 131/81     Pulse Rate 07/03/20 1825 68     Resp 07/03/20 1825 16     Temp 07/03/20 1825 99 F (37.2 C)     Temp Source 07/03/20 1825 Oral     SpO2 07/03/20 1825 99 %     Weight 07/03/20 1811 (!) 320 lb (145.2 kg)     Height 07/03/20 1811 5' 11.5" (1.816 m)     Head Circumference --      Peak Flow --      Pain Score 07/03/20 1810 5     Pain  Loc --      Pain Edu? --      Excl. in GC? --    No data found.  Updated Vital Signs BP 131/81 (BP Location: Right Arm)   Pulse 68   Temp 99 F (37.2 C) (Oral)   Resp 16   Ht 5' 11.5" (1.816 m)   Wt (!) 145.2 kg   SpO2 99%   BMI 44.01 kg/m   Visual Acuity Right Eye Distance:   Left Eye Distance:   Bilateral Distance:    Right Eye Near:   Left Eye Near:    Bilateral Near:     Physical Exam   UC Treatments / Results  Labs (all labs ordered are listed, but only abnormal results are displayed) Labs Reviewed  POCT URINALYSIS DIP (MANUAL ENTRY) - Abnormal; Notable for the following components:      Result Value   Clarity, UA cloudy (*)    Spec Grav, UA >=1.030 (*)    Protein Ur, POC trace (*)    Nitrite, UA Positive (*)    Leukocytes, UA Small (1+) (*)    All other components within normal limits  POCT URINALYSIS DIP (MANUAL ENTRY) - Abnormal; Notable for the following components:   Clarity, UA cloudy (*)    Spec Grav, UA >=1.030 (*)    Protein Ur, POC trace (*)    Nitrite, UA Positive (*)    Leukocytes, UA Small (1+) (*)    All other components within normal  limits  URINE CULTURE    EKG   Radiology No results found.  Procedures Procedures (including critical care time)  Medications Ordered in UC Medications - No data to display  Initial Impression / Assessment and Plan / UC Course  I have reviewed the triage vital signs and the nursing notes.  Pertinent labs & imaging results that were available during my care of the patient were reviewed by me and considered in my medical decision making (see chart for details).    Urine culture pending. Begin Bactrim DS.  Rx for Diflucan at patient's request. Followup with Family Doctor if not improved in one week.   Final Clinical Impressions(s) / UC Diagnoses   Final diagnoses:  Dysuria  Acute cystitis without hematuria     Discharge Instructions      Increase fluid intake. May use non-prescription AZO for about two days, if desired, to decrease urinary discomfort.   If symptoms become significantly worse during the night or over the weekend, proceed to the local emergency room.      ED Prescriptions     Medication Sig Dispense Auth. Provider   fluconazole (DIFLUCAN) 150 MG tablet TAKE ONE TABLET (150 MG DOSE) BY MOUTH ONCE FOR 1 DOSE. REPEAT IN 72 HOURS IF NEEDED 2 tablet Lattie Haw, MD   sulfamethoxazole-trimethoprim (BACTRIM DS) 800-160 MG tablet Take 1 tablet by mouth 2 (two) times daily. 14 tablet Lattie Haw, MD         Lattie Haw, MD 07/05/20 [phone]

## 2020-07-03 NOTE — Discharge Instructions (Addendum)
Increase fluid intake. May use non-prescription AZO for about two days, if desired, to decrease urinary discomfort.  If symptoms become significantly worse during the night or over the weekend, proceed to the local emergency room.  

## 2020-07-03 NOTE — ED Triage Notes (Signed)
Patient has had dysuria for about a week; sees a urologist and has not had follow up from initial evaluation; takes vesicare daily.  Has had all covid vaccinations.

## 2020-07-04 ENCOUNTER — Telehealth: Payer: Self-pay | Admitting: Family Medicine

## 2020-07-04 ENCOUNTER — Telehealth: Payer: Self-pay | Admitting: Emergency Medicine

## 2020-07-04 MED ORDER — NITROFURANTOIN MONOHYD MACRO 100 MG PO CAPS: 100.0000 mg | ORAL_CAPSULE | Freq: Two times a day (BID) | ORAL | 0 refills | Status: DC

## 2020-07-04 NOTE — Telephone Encounter (Signed)
Call from pt - message left w/ pt access - [facility] states she has a rash with the antibiotic prescribed yesterday - requesting a different one. Provider here today updated - awaiting feed back

## 2020-07-04 NOTE — Telephone Encounter (Signed)
Vies patient to discontinue Bactrim now and start Macrobid thank you.

## 2020-07-05 DIAGNOSIS — M7752 Other enthesopathy of left foot: Secondary | ICD-10-CM | POA: Diagnosis not present

## 2020-07-05 LAB — URINE CULTURE
MICRO NUMBER:: 11995335
SPECIMEN QUALITY:: ADEQUATE

## 2020-07-05 MED ORDER — BETAMETHASONE SOD PHOS & ACET 6 (3-3) MG/ML IJ SUSP
3.0000 mg | Freq: Once | INTRAMUSCULAR | Status: AC
  Administered 2020-07-05: 3 mg via INTRA_ARTICULAR

## 2020-07-05 NOTE — Progress Notes (Signed)
   HPI: 40 y.o. female presenting today for follow-up evaluation of an ingrown toenail to the lateral border of the right great toe.  Last visit on 06/15/2020 she opted for conservative debridement of the offending nail border using a nail nipper.  Gentamicin cream and doxycycline was prescribed.  She states that she felt much better.  Patient has a new complaint today regarding pain and tenderness to the dorsal aspect of the left foot.  Patient's began having pain after a physical therapy session last week.  It is very painful to bend her great toe and to walk.  She sometimes experiences some swelling.  She does have a history of endoscopic plantar fasciotomy bilateral  Past Medical History:  Diagnosis Date   ADD (attention deficit disorder)    Chronic pain      Physical Exam: General: The patient is alert and oriented x3 in no acute distress.  Dermatology: Skin is warm, dry and supple bilateral lower extremities. Negative for open lesions or macerations.  Negative for any significant tenderness to palpation to the lateral border of the right great toenail  Vascular: Palpable pedal pulses bilaterally. No edema or erythema noted. Capillary refill within normal limits.  Neurological: Epicritic and protective threshold grossly intact bilaterally.   Musculoskeletal Exam: Range of motion within normal limits to all pedal and ankle joints bilateral. Muscle strength 5/5 in all groups bilateral.  Pain on palpation along the extensor hallucis longus tendon left   Assessment: 1.  EHL tendinitis left 2.  Ingrown toenail right hallux lateral border; mostly resolved 3.  H/o Endoscopic Plantar Fasciotomy B/L. DOS: 12/26/2019   Plan of Care:  1. Patient evaluated. 2.  Injection of 0.5 cc Celestone Soluspan injected along the extensor hallucis longus tendon sheath left 3.  Resume cam boot left foot x2 weeks.  Weightbearing as tolerated 4.  Return to clinic in 4 weeks  *Works for [employer]       Felecia Shelling, DPM [facility]  Dr. Felecia Shelling, DPM    [address] [place], [place] [address]                Office [phone]  Fax [phone]

## 2020-07-06 ENCOUNTER — Emergency Department
Admission: EM | Admit: 2020-07-06 | Discharge: 2020-07-06 | Disposition: A | Discharge status: Other Acute Inpatient Hospital | Payer: Managed Care, Other (non HMO) | Source: Home / Self Care

## 2020-07-06 ENCOUNTER — Encounter: Payer: Self-pay | Admitting: Emergency Medicine

## 2020-07-06 ENCOUNTER — Ambulatory Visit: Payer: Managed Care, Other (non HMO) | Admitting: Podiatry

## 2020-07-06 ENCOUNTER — Other Ambulatory Visit: Payer: Self-pay

## 2020-07-06 DIAGNOSIS — R200 Anesthesia of skin: Secondary | ICD-10-CM | POA: Diagnosis not present

## 2020-07-06 DIAGNOSIS — H538 Other visual disturbances: Secondary | ICD-10-CM

## 2020-07-06 DIAGNOSIS — S0990XA Unspecified injury of head, initial encounter: Secondary | ICD-10-CM | POA: Diagnosis not present

## 2020-07-06 NOTE — Discharge Instructions (Addendum)
Go to the ER for further evaluation and treatment. 

## 2020-07-06 NOTE — ED Triage Notes (Signed)
Patient hit head on window sash about 4 hours ago; no bleeding but feels that left side of face aches and feels edematous under left eye. No LOC.

## 2020-07-13 NOTE — ED Provider Notes (Signed)
[facility]    CSN: [id number] Arrival date & time: 07/06/20  2020      History   Chief Complaint Chief Complaint  Patient presents with   Head Injury    HPI [facility] is a 40 y.o. female.   Reports that she hit her head on the corner of a cabinet earlier today. Reports dizziness, left jaw pain, left facial numbness, headache, and blurred vision. Denies previous symptoms. Denies OTC treatment. Denies loss of consciousness, loss of vision, changes in taste or smell, other symptoms.  ROS per HPI  The history is provided by the patient.  Head Injury  Past Medical History:  Diagnosis Date   ADD (attention deficit disorder)    Chronic pain     Patient Active Problem List   Diagnosis Date Noted   MIGRAINE HEADACHE 11/18/2008   BENIGN POSITIONAL VERTIGO 11/18/2008   POLYCYSTIC OVARIES 10/28/2008   ALLERGIC RHINITIS CAUSE UNSPECIFIED 06/02/2008   URINARY TRACT INFECTION, RECURRENT 03/25/2008   OBESITY, UNSPECIFIED 12/14/2006   ESSENTIAL HYPERTENSION, BENIGN 12/14/2006   DISC DISEASE, LUMBAR 12/14/2006    History reviewed. No pertinent surgical history.  OB History   No obstetric history on file.      Home Medications    Prior to Admission medications   Medication Sig Start Date End Date Taking? Authorizing Provider  acetaminophen (TYLENOL) 500 MG tablet Take by mouth. 12/06/18   [provider]  albuterol (VENTOLIN HFA) 108 (90 Base) MCG/ACT inhaler Inhale into the lungs. 07/21/13   [provider]  azelastine (ASTELIN) 0.1 % nasal spray SMARTSIG:2 Spray(s) Both Nares Twice Daily PRN 06/15/19   [provider]  azelastine (ASTELIN) 0.1 % nasal spray 2 sprays 2 (two) times daily. 07/13/19   [provider]  buprenorphine (BUTRANS) 7.5 MCG/HR 1 patch once a week. 06/02/20   [provider]  buPROPion (WELLBUTRIN XL) 300 MG 24 hr tablet Take 300 mg by mouth daily. 06/16/19   [provider]  celecoxib  (CELEBREX) 200 MG capsule Take by mouth 2 (two) times daily. 06/14/19   [provider]  desvenlafaxine (PRISTIQ) 100 MG 24 hr tablet Take 100 mg by mouth daily. 03/13/19   [provider]  doxycycline (VIBRA-TABS) 100 MG tablet Take 1 tablet (100 mg total) by mouth 2 (two) times daily. 06/15/20   Felecia Shelling, DPM  fluconazole (DIFLUCAN) 150 MG tablet TAKE ONE TABLET (150 MG DOSE) BY MOUTH ONCE FOR 1 DOSE. REPEAT IN 72 HOURS IF NEEDED 07/03/20   Lattie Haw, MD  fluticasone Aleda Grana) 50 MCG/ACT nasal spray  04/18/19   [provider]  gentamicin cream (GARAMYCIN) 0.1 % Apply 1 application topically 2 (two) times daily. 06/15/20   Felecia Shelling, DPM  ibuprofen (ADVIL) 800 MG tablet Take 1 tablet every 8 hours as needed for pain 12/06/18   [provider]  ipratropium (ATROVENT) 0.03 % nasal spray  04/18/19   [provider]  lisdexamfetamine (VYVANSE) 30 MG capsule Take by mouth. 07/08/19   [provider]  lisinopril (ZESTRIL) 40 MG tablet Take 40 mg by mouth daily. 04/22/19   [provider]  lisinopril (ZESTRIL) 40 MG tablet SMARTSIG:1 Tablet(s) By Mouth Daily 07/12/19   [provider]  medroxyPROGESTERone (DEPO-PROVERA) 150 MG/ML injection Inject into the muscle. 02/16/17   [provider]  meloxicam (MOBIC) 15 MG tablet Take 1 tablet (15 mg total) by mouth daily. 06/26/20   Felecia Shelling, DPM  methylPREDNISolone (MEDROL  DOSEPAK) 4 MG TBPK tablet 6 day dose pack - take as directed 06/26/20   Felecia Shelling, DPM  metoprolol tartrate (LOPRESSOR) 25 MG tablet Take 25 mg by mouth 2 (two) times daily. 02/13/20   [provider]  mirtazapine (REMERON SOL-TAB) 15 MG disintegrating tablet Take by mouth. 11/28/18   [provider]  montelukast (SINGULAIR) 10 MG tablet Take 10 mg by mouth daily. 04/22/19   [provider]  nadolol (CORGARD) 80 MG tablet TAKE ONE TABLET BY MOUTH DAILY 04/16/19   [provider]  naloxone (NARCAN) nasal spray 4 mg/0.1 mL Place into the nose. 08/13/19   [provider]  nitrofurantoin, macrocrystal-monohydrate, (MACROBID) 100 MG capsule Take 1 capsule (100 mg total) by mouth 2 (two) times daily. 07/04/20   Trevor Iha, FNP  OXcarbazepine (TRILEPTAL) 150 MG tablet Take 150 mg by mouth at bedtime. 06/11/19   [provider]  oxyCODONE-acetaminophen (PERCOCET) 5-325 MG tablet Take 1 tablet by mouth every 4 (four) hours as needed for severe pain. 12/26/19   Felecia Shelling, DPM  pregabalin (LYRICA) 200 MG capsule Take 200 mg by mouth 3 (three) times daily. 06/14/19   [provider]  REXULTI 0.5 MG TABS Take 1 tablet by mouth daily. 03/13/19   [provider]  sulfamethoxazole-trimethoprim (BACTRIM DS) 800-160 MG tablet Take 1 tablet by mouth 2 (two) times daily. 07/03/20   Lattie Haw, MD  tizanidine (ZANAFLEX) 2 MG capsule  06/14/19   [provider]  traZODone (DESYREL) 50 MG tablet TAKE 1 TABLET BY MOUTH  NIGHTLY AS NEEDED FOR SLEEP 09/26/17   [provider]  Vilazodone HCl (VIIBRYD) 20 MG TABS Take by mouth. 07/25/18   [provider]  VYVANSE 20 MG capsule Take 20 mg by mouth every morning. 06/06/19   [provider]    Family History No family history on file.  Social History     Allergies   Dust mite extract, Molds & smuts, Other, Quercus robur, Tramadol, Amoxicillin-pot clavulanate, Tramadol hcl, Bee pollen, and Pollen extract   Review of Systems Review of Systems   Physical Exam Triage Vital Signs ED Triage Vitals  Enc Vitals Group     BP 07/06/20 2036 (!) 150/84     Pulse Rate 07/06/20 2036 64     Resp 07/06/20 2036 16     Temp 07/06/20 2036 98.8 F (37.1 C)     Temp Source 07/06/20 2036 Oral     SpO2 07/06/20 2036 99 %     Weight 07/06/20 2039 (!) 319 lb 10.7 oz (145 kg)     Height 07/06/20 2039 5' 11.5" (1.816 m)     Head Circumference --      Peak Flow --       Pain Score 07/06/20 2039 6     Pain Loc --      Pain Edu? --      Excl. in GC? --    No data found.  Updated Vital Signs BP (!) 150/84 (BP Location: Right Wrist)   Pulse 64   Temp 98.8 F (37.1 C) (Oral)   Resp 16   Ht 5' 11.5" (1.816 m)   Wt (!) 319 lb 10.7 oz (145 kg)   SpO2 99%   BMI 43.96 kg/m   Visual Acuity Right Eye Distance:   Left Eye Distance:   Bilateral Distance:    Right Eye Near:   Left Eye Near:    Bilateral Near:  Physical Exam Vitals and nursing note reviewed.  Constitutional:      General: She is not in acute distress.    Appearance: Normal appearance. She is well-developed.  HENT:     Head: Normocephalic and atraumatic.     Nose: Nose normal.     Mouth/Throat:     Mouth: Mucous membranes are moist.     Pharynx: Oropharynx is clear.  Eyes:     Extraocular Movements: Extraocular movements intact.     Conjunctiva/sclera: Conjunctivae normal.     Pupils: Pupils are equal, round, and reactive to light.  Cardiovascular:     Rate and Rhythm: Normal rate and regular rhythm.  Pulmonary:     Effort: Pulmonary effort is normal. No respiratory distress.  Musculoskeletal:        General: Normal range of motion.     Cervical back: Normal range of motion and neck supple.  Skin:    General: Skin is warm and dry.     Capillary Refill: Capillary refill takes less than 2 seconds.  Neurological:     General: No focal deficit present.     Mental Status: She is alert and oriented to person, place, and time.  Psychiatric:        Mood and Affect: Mood normal.        Behavior: Behavior normal.        Thought Content: Thought content normal.     UC Treatments / Results  Labs (all labs ordered are listed, but only abnormal results are displayed) Labs Reviewed - No data to display  EKG   Radiology No results found.  Procedures Procedures (including critical care time)  Medications Ordered in UC Medications - No data to display  Initial  Impression / Assessment and Plan / UC Course  I have reviewed the triage vital signs and the nursing notes.  Pertinent labs & imaging results that were available during my care of the patient were reviewed by me and considered in my medical decision making (see chart for details).    Head Injury Left Facial Numbness Blurred Vision  Discussed that given her symptoms and recent head injury and symptoms, that she would be best served in the ER but that she should not drive herself Verbalized understanding and agrees with treatment plan To ER via POV  Final Clinical Impressions(s) / UC Diagnoses   Final diagnoses:  Injury of head, initial encounter  Left facial numbness  Blurred vision, left eye     Discharge Instructions      Go to the ER for further evaluation and treatment      ED Prescriptions   None    PDMP not reviewed this encounter.   Moshe Cipro, NP 07/13/20 1203

## 2020-07-14 ENCOUNTER — Other Ambulatory Visit: Payer: Self-pay

## 2020-07-14 ENCOUNTER — Encounter: Payer: Self-pay | Admitting: Podiatry

## 2020-07-14 ENCOUNTER — Ambulatory Visit: Payer: Managed Care, Other (non HMO) | Admitting: Podiatry

## 2020-07-14 DIAGNOSIS — M4800 Spinal stenosis, site unspecified: Secondary | ICD-10-CM | POA: Insufficient documentation

## 2020-07-14 DIAGNOSIS — L600 Ingrowing nail: Secondary | ICD-10-CM

## 2020-07-14 DIAGNOSIS — M778 Other enthesopathies, not elsewhere classified: Secondary | ICD-10-CM

## 2020-07-14 DIAGNOSIS — N3281 Overactive bladder: Secondary | ICD-10-CM | POA: Insufficient documentation

## 2020-07-14 NOTE — Patient Instructions (Signed)

## 2020-07-15 ENCOUNTER — Emergency Department
Admission: EM | Admit: 2020-07-15 | Discharge: 2020-07-15 | Disposition: A | Discharge status: Home / Self Care | Payer: Managed Care, Other (non HMO) | Source: Home / Self Care

## 2020-07-15 ENCOUNTER — Other Ambulatory Visit: Payer: Self-pay

## 2020-07-15 ENCOUNTER — Other Ambulatory Visit (HOSPITAL_COMMUNITY)
Admission: RE | Admit: 2020-07-15 | Discharge: 2020-07-15 | Disposition: A | Discharge status: Home / Self Care | Payer: Managed Care, Other (non HMO) | Source: Ambulatory Visit | Attending: Physician Assistant | Admitting: Physician Assistant

## 2020-07-15 DIAGNOSIS — R1030 Lower abdominal pain, unspecified: Secondary | ICD-10-CM | POA: Insufficient documentation

## 2020-07-15 DIAGNOSIS — N3000 Acute cystitis without hematuria: Secondary | ICD-10-CM | POA: Insufficient documentation

## 2020-07-15 DIAGNOSIS — Z87440 Personal history of urinary (tract) infections: Secondary | ICD-10-CM | POA: Diagnosis not present

## 2020-07-15 DIAGNOSIS — R300 Dysuria: Secondary | ICD-10-CM | POA: Diagnosis not present

## 2020-07-15 DIAGNOSIS — Z882 Allergy status to sulfonamides status: Secondary | ICD-10-CM | POA: Insufficient documentation

## 2020-07-15 HISTORY — DX: Depression, unspecified: F32.A

## 2020-07-15 HISTORY — DX: Anxiety disorder, unspecified: F41.9

## 2020-07-15 LAB — POCT URINALYSIS DIP (MANUAL ENTRY)
Bilirubin, UA: NEGATIVE
Blood, UA: NEGATIVE
Glucose, UA: NEGATIVE mg/dL
Ketones, POC UA: NEGATIVE mg/dL
Leukocytes, UA: NEGATIVE
Nitrite, UA: NEGATIVE
Spec Grav, UA: >=1.03 — AB (ref 1.010–1.025)
Urobilinogen, UA: 0.2 E.U./dL
pH, UA: 5.5 (ref 5.0–8.0)

## 2020-07-15 MED ORDER — CIPROFLOXACIN HCL 250 MG PO TABS: 250.0000 mg | ORAL_TABLET | Freq: Two times a day (BID) | ORAL | 0 refills | Status: AC

## 2020-07-15 NOTE — ED Triage Notes (Signed)
Pt presents to Urgent Care with c/o continued s/s of UTI that was dx and treated recently. Pt reports doing better after beginning Macrobid, but urinary frequency and lower abdominal/back pain returned toward end of course approx 5-6 days ago.

## 2020-07-15 NOTE — Discharge Instructions (Addendum)
DO NOT TAKE CIPROFLOXACIN WITH TIZANIDINE.  Going to call in ciprofloxacin to cover for urinary tract infection.  Take it twice a day for up to a week.  We will contact you with your urine culture results if we need to stop the medication or change it.  Please follow-up with urology as scheduled July 19.  Drink plenty of fluid.  If you have any worsening symptoms you need to return for reevaluation as we discussed.

## 2020-07-15 NOTE — ED Provider Notes (Signed)
[facility]    CSN: [id number] Arrival date & time: 07/15/20  1810      History   Chief Complaint Chief Complaint  Patient presents with   Urinary Frequency   Abdominal Pain    lower    HPI Caroline Nelson is a 40 y.o. female.   Patient presents today with several day history of recurrent UTI symptoms.  She was last seen 07/03/2020 which when she was started on Bactrim but developed a rash so this was discontinued in place of Macrobid.  Urine culture obtained at that visit grew E. coli resistant to Bactrim but sensitive to Macrobid.  She reports symptoms improved but never completely resolved following use of this medication.  She continues to have lower abdominal pain, dysuria, urinary frequency, urinary urgency.  She denies any vaginal discharge, pelvic pain, nausea, vomiting, fever.  She does report some headache and dizziness but attributes this to a concussion that she sustained on 07/06/2020.  She denies additional antibiotic use.  She has seen a urologist in the past and scheduled for follow-up on 08/11/2020.  She denies history of nephrolithiasis, single kidney, self-catheterization, recent urogenital procedure.  She reports associated back pain that is interfering with her body perform daily activities.  Pain is rated 4 on a 0-10 pain scale, localized to lower abdomen with radiation towards back, described as aching, no aggravating or alleviating factors identified.   Past Medical History:  Diagnosis Date   ADD (attention deficit disorder)    Anxiety    Chronic pain    Depression     Patient Active Problem List   Diagnosis Date Noted   Overactive bladder 07/14/2020   Spinal stenosis 07/14/2020   Current severe episode of major depressive disorder without psychotic features (HCC) 11/14/2019   Pre-diabetes 04/04/2017   Chronic midline low back pain with bilateral sciatica 09/01/2016   Recurrent UTI 01/02/2015   Allergic rhinoconjunctivitis of both eyes  01/01/2015   Mild persistent asthma, uncomplicated 01/01/2015   Chondromalacia of both patellae 09/04/2014   Chronic pain of both knees 08/18/2014   Pain of both sacroiliac joints 08/18/2014   Surveillance of (intrauterine) contraceptive device 06/06/2014   Seasonal allergies 06/07/2013   Chronic pain syndrome 04/01/2013   Arthropathy of lumbar facet joint 04/01/2013   Recurrent major depressive disorder, in partial remission (HCC) 12/06/2012   History of bariatric surgery 12/06/2012   OSA (obstructive sleep apnea) 10/04/2012   Seborrheic dermatitis of scalp 09/18/2012   Intractable chronic migraine without aura 04/19/2012   Anxiety 05/02/2011   Depression 10/22/2010   MIGRAINE HEADACHE 11/18/2008   BENIGN POSITIONAL VERTIGO 11/18/2008   POLYCYSTIC OVARIES 10/28/2008   ALLERGIC RHINITIS CAUSE UNSPECIFIED 06/02/2008   URINARY TRACT INFECTION, RECURRENT 03/25/2008   OBESITY, UNSPECIFIED 12/14/2006   ESSENTIAL HYPERTENSION, BENIGN 12/14/2006   DISC DISEASE, LUMBAR 12/14/2006    Past Surgical History:  Procedure Laterality Date   HERNIA REPAIR     LAPAROSCOPIC GASTRIC SLEEVE RESECTION     OVARIAN CYST REMOVAL Right     OB History   No obstetric history on file.      Home Medications    Prior to Admission medications   Medication Sig Start Date End Date Taking? Authorizing Provider  ciprofloxacin (CIPRO) 250 MG tablet Take 1 tablet (250 mg total) by mouth every 12 (twelve) hours for 7 days. 07/15/20 07/22/20 Yes Isadora Delorey, Noberto Retort, PA-C  NON FORMULARY KETAMINE treatment for depression--unsure of dose   Yes [provider]  Oxcarbazepine (  TRILEPTAL) 300 MG tablet Take 600 mg by mouth daily.   Yes [provider]  solifenacin (VESICARE) 5 MG tablet Take 5 mg by mouth daily.   Yes [provider]  acetaminophen (TYLENOL) 500 MG tablet Take by mouth. 12/06/18   [provider]  albuterol (VENTOLIN HFA) 108 (90 Base) MCG/ACT inhaler Inhale into the  lungs. 07/21/13   [provider]  azelastine (ASTELIN) 0.1 % nasal spray SMARTSIG:2 Spray(s) Both Nares Twice Daily PRN 06/15/19   [provider]  azelastine (ASTELIN) 0.1 % nasal spray 2 sprays 2 (two) times daily. 07/13/19   [provider]  buprenorphine (BUTRANS) 7.5 MCG/HR 1 patch once a week. 06/02/20   [provider]  buPROPion (WELLBUTRIN XL) 300 MG 24 hr tablet Take 300 mg by mouth daily. 06/16/19   [provider]  EPINEPHrine 0.3 mg/0.3 mL IJ SOAJ injection SMARTSIG:0.3 Milliliter(s) IM Daily PRN 05/27/20   [provider]  fluticasone Aleda Grana(FLONASE) 50 MCG/ACT nasal spray  04/18/19   [provider]  gentamicin cream (GARAMYCIN) 0.1 % Apply 1 application topically 2 (two) times daily. 06/15/20   Felecia ShellingEvans, Brent M, DPM  ipratropium (ATROVENT) 0.03 % nasal spray  04/18/19   [provider]  lisinopril (ZESTRIL) 40 MG tablet Take 40 mg by mouth daily. 04/22/19   [provider]  lisinopril (ZESTRIL) 40 MG tablet SMARTSIG:1 Tablet(s) By Mouth Daily 07/12/19   [provider]  medroxyPROGESTERone (DEPO-PROVERA) 150 MG/ML injection Inject into the muscle. 02/16/17   [provider]  meloxicam (MOBIC) 15 MG tablet Take 1 tablet (15 mg total) by mouth daily. 06/26/20   Felecia ShellingEvans, Brent M, DPM  metoprolol tartrate (LOPRESSOR) 25 MG tablet Take 25 mg by mouth 2 (two) times daily. 02/13/20   [provider]  naloxone [facility](NARCAN) nasal spray 4 mg/0.1 mL Place into the nose. 08/13/19   [provider]  naltrexone (DEPADE) 50 MG tablet Take 25 mg by mouth daily. 02/26/20   [provider]  OXcarbazepine (TRILEPTAL) 150 MG tablet Take 150 mg by mouth at bedtime. 06/11/19   [provider]  pregabalin (LYRICA) 200 MG capsule Take 200 mg by mouth 3 (three) times daily. 06/14/19   [provider]    Family History Family History  Problem Relation Age of Onset   Hypertension Mother     Cancer Father    Heart failure Father    Diabetes Father    Hypertension Father     Social History Social History   Tobacco Use   Smoking status: Never   Smokeless tobacco: Never  Vaping Use   Vaping Use: Never used  Substance Use Topics   Alcohol use: Yes    Comment: rarely   Drug use: Not Currently     Allergies   Dust mite extract, Molds & smuts, Other, Quercus robur, Sulfa antibiotics, Tramadol, Amoxicillin-pot clavulanate, Bactrim [sulfamethoxazole-trimethoprim], Tramadol hcl, Bee pollen, and Pollen extract   Review of Systems Review of Systems  Constitutional:  Negative for activity change, appetite change, fatigue and fever.  Respiratory:  Negative for cough and shortness of breath.   Cardiovascular:  Negative for chest pain.  Gastrointestinal:  Positive for abdominal pain and nausea. Negative for diarrhea and vomiting.  Genitourinary:  Positive for dysuria, frequency, urgency and vaginal pain. Negative for pelvic pain, vaginal bleeding and vaginal discharge.  Musculoskeletal:  Positive for back pain. Negative for arthralgias and neck pain.  Neurological:  Positive for dizziness (Due to concussion) and headaches (  Due to concussion). Negative for light-headedness.    Physical Exam Triage Vital Signs ED Triage Vitals  Enc Vitals Group     BP 07/15/20 1854 132/86     Pulse Rate 07/15/20 1854 72     Resp 07/15/20 1854 18     Temp 07/15/20 1854 99.1 F (37.3 C)     Temp Source 07/15/20 1854 Oral     SpO2 07/15/20 1854 99 %     Weight 07/15/20 1834 (!) 320 lb (145.2 kg)     Height 07/15/20 1834 5' 11.5" (1.816 m)     Head Circumference --      Peak Flow --      Pain Score 07/15/20 1834 4     Pain Loc --      Pain Edu? --      Excl. in GC? --    No data found.  Updated Vital Signs BP 132/86   Pulse 72   Temp 99.1 F (37.3 C) (Oral)   Resp 18   Ht 5' 11.5" (1.816 m)   Wt (!) 320 lb (145.2 kg)   SpO2 99%   BMI 44.01 kg/m   Visual Acuity Right Eye  Distance:   Left Eye Distance:   Bilateral Distance:    Right Eye Near:   Left Eye Near:    Bilateral Near:     Physical Exam Vitals reviewed.  Constitutional:      General: She is awake. She is not in acute distress.    Appearance: Normal appearance. She is normal weight. She is not ill-appearing.     Comments: Very pleasant female appears stated age in no acute distress  HENT:     Head: Normocephalic and atraumatic.  Cardiovascular:     Rate and Rhythm: Normal rate and regular rhythm.     Heart sounds: Normal heart sounds, S1 normal and S2 normal. No murmur heard. Pulmonary:     Effort: Pulmonary effort is normal.     Breath sounds: Normal breath sounds. No wheezing, rhonchi or rales.     Comments: Clear to auscultation bilaterally Abdominal:     General: Bowel sounds are normal.     Palpations: Abdomen is soft.     Tenderness: There is no abdominal tenderness. There is no right CVA tenderness, left CVA tenderness, guarding or rebound.     Comments: Benign abdominal exam; nontender to palpation.  No CVA tenderness.  Genitourinary:    Comments: Exam deferred Musculoskeletal:     Cervical back: No tenderness or bony tenderness.     Thoracic back: Tenderness present. No bony tenderness.     Lumbar back: Tenderness present. No bony tenderness.  Psychiatric:        Behavior: Behavior is cooperative.     UC Treatments / Results  Labs (all labs ordered are listed, but only abnormal results are displayed) Labs Reviewed  POCT URINALYSIS DIP (MANUAL ENTRY) - Abnormal; Notable for the following components:      Result Value   Color, UA straw (*)    Clarity, UA cloudy (*)    Spec Grav, UA >=1.030 (*)    Protein Ur, POC trace (*)    All other components within normal limits  URINE CULTURE  CERVICOVAGINAL ANCILLARY ONLY    EKG   Radiology No results found.  Procedures Procedures (including critical care time)  Medications Ordered in UC Medications - No data to  display  Initial Impression / Assessment and Plan / UC Course  I have  reviewed the triage vital signs and the nursing notes.  Pertinent labs & imaging results that were available during my care of the patient were reviewed by me and considered in my medical decision making (see chart for details).      Given abnormalities on UA as well as clinical presentation patient was empirically treated with ciprofloxacin as previous urine culture showed bacteria was sensitive to this medication.  She was instructed not to take tizanidine with this medication as it can increase the risk of dizziness and hypotension.  She was encouraged to drink plenty of fluid.  Urine culture was obtained and we discussed potential need to change antibiotics based on susceptibilities identified on culture.  STI panel collected today-results pending.  We will contact patient if additional treatment is required.  Discussed alarm symptoms that warrant emergent evaluation.  Strongly encourage patient to follow-up with urology given recurrent symptoms as scheduled.  Strict return precautions given to which patient expressed understanding.  The end of visit, patient reported intermittent esophageal spasms.  She is currently asymptomatic and denies any significant abdominal or chest pain.  We discussed that this can be caused by a variety of reasons but encouraged her to take acid reducing medication such as Pepcid to help manage symptoms.  Encouraged her to follow-up with gastroenterologist for further evaluation and management.  Discussed alarm symptoms that warrant emergent evaluation.  Final Clinical Impressions(s) / UC Diagnoses   Final diagnoses:  Acute cystitis without hematuria  Dysuria  Lower abdominal pain     Discharge Instructions      DO NOT TAKE CIPROFLOXACIN WITH TIZANIDINE.  Going to call in ciprofloxacin to cover for urinary tract infection.  Take it twice a day for up to a week.  We will contact you with  your urine culture results if we need to stop the medication or change it.  Please follow-up with urology as scheduled July 19.  Drink plenty of fluid.  If you have any worsening symptoms you need to return for reevaluation as we discussed.     ED Prescriptions     Medication Sig Dispense Auth. Provider   ciprofloxacin (CIPRO) 250 MG tablet Take 1 tablet (250 mg total) by mouth every 12 (twelve) hours for 7 days. 14 tablet Blaire Palomino, Noberto Retort, PA-C      PDMP not reviewed this encounter.   Jeani Hawking, PA-C 07/15/20 2008

## 2020-07-16 LAB — CERVICOVAGINAL ANCILLARY ONLY
Bacterial Vaginitis (gardnerella): NEGATIVE
Candida Glabrata: NEGATIVE
Candida Vaginitis: NEGATIVE
Chlamydia: NEGATIVE
Comment: NEGATIVE
Comment: NEGATIVE
Comment: NEGATIVE
Comment: NEGATIVE
Comment: NEGATIVE
Comment: NORMAL
Neisseria Gonorrhea: NEGATIVE
Trichomonas: NEGATIVE

## 2020-07-16 LAB — URINE CULTURE
MICRO NUMBER:: 12038470
SPECIMEN QUALITY:: ADEQUATE

## 2020-07-22 NOTE — Progress Notes (Signed)
Subjective: 40 year old female presents the office today for concerns of possible reoccurrence of ingrown toenail on the right big toe.  She saw Dr. Logan Bores for this and he turned the corner out and did well with a started to come back again.  Denies any redness or drainage or any swelling.  Also she has been getting discomfort in the ball of her left foot.  She denies any recent injury or trauma.  She last saw Dr. Logan Bores as well for pain in her left foot this is more dorsal and has moved somewhat plantarly now.  Metatarsal pads have been helpful.  No recent changes otherwise. Denies any systemic complaints such as fevers, chills, nausea, vomiting. No acute changes since last appointment, and no other complaints at this time.   Objective: AAO x3, NAD DP/PT pulses palpable bilaterally, CRT less than 3 seconds There is incurvation present to the right hallux toenail distally with tenderness to palpation.  There is no edema, erythema and signs of infection noted today.  There is mild diffuse discomfort left foot submetatarsal 1 through 5 there is no area of pinpoint tenderness.  There is no edema, erythema. No pain with calf compression, swelling, warmth, erythema  Assessment: Right hallux ingrown toenail, metatarsalgia left foot  Plan: -All treatment options discussed with the patient including all alternatives, risks, complications.  -Discussed partial nail avulsion with chemical hysterectomy but she wants to hold off on this today.  I sharply debrided the nail without any complications or bleeding.  Recommend Epson salt soaks as needed. -Regards to the left foot pain recommended to continue shoes and good arch supports.  Metatarsal pads were dispensed.  If symptoms continue x-ray left foot next appointment.  Caroline Nelson DPM

## 2020-07-29 ENCOUNTER — Ambulatory Visit: Payer: Managed Care, Other (non HMO) | Admitting: Podiatry

## 2020-08-23 ENCOUNTER — Other Ambulatory Visit: Payer: Self-pay

## 2020-08-23 ENCOUNTER — Emergency Department (INDEPENDENT_AMBULATORY_CARE_PROVIDER_SITE_OTHER)
Admission: EM | Admit: 2020-08-23 | Discharge: 2020-08-23 | Disposition: A | Discharge status: Home / Self Care | Payer: Managed Care, Other (non HMO) | Source: Home / Self Care

## 2020-08-23 ENCOUNTER — Emergency Department: Admit: 2020-08-23 | Payer: Self-pay

## 2020-08-23 DIAGNOSIS — N3001 Acute cystitis with hematuria: Secondary | ICD-10-CM

## 2020-08-23 DIAGNOSIS — R300 Dysuria: Secondary | ICD-10-CM

## 2020-08-23 LAB — POCT URINALYSIS DIP (MANUAL ENTRY)
Glucose, UA: 100 mg/dL — AB
Leukocytes, UA: NEGATIVE
Nitrite, UA: POSITIVE — AB
Protein Ur, POC: 100 mg/dL — AB
Spec Grav, UA: >=1.03 — AB (ref 1.010–1.025)
Urobilinogen, UA: 1 E.U./dL
pH, UA: 5 (ref 5.0–8.0)

## 2020-08-23 MED ORDER — NITROFURANTOIN MONOHYD MACRO 100 MG PO CAPS: 100.0000 mg | ORAL_CAPSULE | Freq: Two times a day (BID) | ORAL | 0 refills | Status: DC

## 2020-08-23 NOTE — ED Notes (Signed)
Pt has severe depression, seeing therapist and receiving Ketamine treatments. States she has wished to be dead, but is not suicidal--no plan to end life within the past month. Behavioral health resource info sheets given and Jennings Books, FNP made aware.

## 2020-08-23 NOTE — Discharge Instructions (Addendum)
Advised patient to take medication as directed to completion.  Encouraged patient increase daily water intake while taking this medication.  Advised patient we will follow-up with urine culture results once received.

## 2020-08-23 NOTE — ED Triage Notes (Addendum)
Pt presents to Urgent Care with c/o dysuria, lower abdominal pain, and foul odor to urine since yesterday. Pt taking AZO.

## 2020-08-23 NOTE — ED Provider Notes (Signed)
[facility]    CSN: [id number] Arrival date & time: 08/23/20  1045      History   Chief Complaint Chief Complaint  Patient presents with   Dysuria   Abdominal Pain    suprapubic    HPI Caroline Nelson is a 40 y.o. female.   HPI 40 year old female presents with dysuria, super pubic abdominal pain and foul odor of urine for 2 days.  Patient reports taking AZO.  PMH significant for and UTI recurrent with most recent on 07/15/2020 was prescribed Cipro.  Past Medical History:  Diagnosis Date   ADD (attention deficit disorder)    Anxiety    Chronic pain    Depression     Patient Active Problem List   Diagnosis Date Noted   Overactive bladder 07/14/2020   Spinal stenosis 07/14/2020   Current severe episode of major depressive disorder without psychotic features (HCC) 11/14/2019   Pre-diabetes 04/04/2017   Chronic midline low back pain with bilateral sciatica 09/01/2016   Recurrent UTI 01/02/2015   Allergic rhinoconjunctivitis of both eyes 01/01/2015   Mild persistent asthma, uncomplicated 01/01/2015   Chondromalacia of both patellae 09/04/2014   Chronic pain of both knees 08/18/2014   Pain of both sacroiliac joints 08/18/2014   Surveillance of (intrauterine) contraceptive device 06/06/2014   Seasonal allergies 06/07/2013   Chronic pain syndrome 04/01/2013   Arthropathy of lumbar facet joint 04/01/2013   Recurrent major depressive disorder, in partial remission (HCC) 12/06/2012   History of bariatric surgery 12/06/2012   OSA (obstructive sleep apnea) 10/04/2012   Seborrheic dermatitis of scalp 09/18/2012   Intractable chronic migraine without aura 04/19/2012   Anxiety 05/02/2011   Depression 10/22/2010   MIGRAINE HEADACHE 11/18/2008   BENIGN POSITIONAL VERTIGO 11/18/2008   POLYCYSTIC OVARIES 10/28/2008   ALLERGIC RHINITIS CAUSE UNSPECIFIED 06/02/2008   URINARY TRACT INFECTION, RECURRENT 03/25/2008   OBESITY, UNSPECIFIED 12/14/2006   ESSENTIAL  HYPERTENSION, BENIGN 12/14/2006   DISC DISEASE, LUMBAR 12/14/2006    Past Surgical History:  Procedure Laterality Date   HERNIA REPAIR     LAPAROSCOPIC GASTRIC SLEEVE RESECTION     OVARIAN CYST REMOVAL Right     OB History   No obstetric history on file.      Home Medications    Prior to Admission medications   Medication Sig Start Date End Date Taking? Authorizing Provider  nitrofurantoin, macrocrystal-monohydrate, (MACROBID) 100 MG capsule Take 1 capsule (100 mg total) by mouth 2 (two) times daily. 08/23/20  Yes Caroline Iha, FNP  acetaminophen (TYLENOL) 500 MG tablet Take by mouth. 12/06/18   [provider]  albuterol (VENTOLIN HFA) 108 (90 Base) MCG/ACT inhaler Inhale into the lungs. 07/21/13   [provider]  azelastine (ASTELIN) 0.1 % nasal spray SMARTSIG:2 Spray(s) Both Nares Twice Daily PRN 06/15/19   [provider]  azelastine (ASTELIN) 0.1 % nasal spray 2 sprays 2 (two) times daily. 07/13/19   [provider]  buprenorphine (BUTRANS) 7.5 MCG/HR 1 patch once a week. 06/02/20   [provider]  buPROPion (WELLBUTRIN XL) 300 MG 24 hr tablet Take 300 mg by mouth daily. 06/16/19   [provider]  EPINEPHrine 0.3 mg/0.3 mL IJ SOAJ injection SMARTSIG:0.3 Milliliter(s) IM Daily PRN 05/27/20   [provider]  fluticasone Aleda Grana) 50 MCG/ACT nasal spray  04/18/19   [provider]  gentamicin cream (GARAMYCIN) 0.1 % Apply 1 application topically 2 (two) times daily. 06/15/20   Felecia Shelling, DPM  ipratropium (ATROVENT) 0.03 %  nasal spray  04/18/19   [provider]  lisinopril (ZESTRIL) 40 MG tablet Take 40 mg by mouth daily. 04/22/19   [provider]  lisinopril (ZESTRIL) 40 MG tablet SMARTSIG:1 Tablet(s) By Mouth Daily 07/12/19   [provider]  medroxyPROGESTERone (DEPO-PROVERA) 150 MG/ML injection Inject into the muscle. 02/16/17   [provider]  meloxicam (MOBIC) 15 MG  tablet Take 1 tablet (15 mg total) by mouth daily. 06/26/20   Felecia ShellingEvans, Brent M, DPM  metoprolol tartrate (LOPRESSOR) 25 MG tablet Take 25 mg by mouth 2 (two) times daily. 02/13/20   [provider]  naloxone [facility](NARCAN) nasal spray 4 mg/0.1 mL Place into the nose. 08/13/19   [provider]  naltrexone (DEPADE) 50 MG tablet Take 25 mg by mouth daily. 02/26/20   [provider]  NON FORMULARY KETAMINE treatment for depression--unsure of dose    [provider]  OXcarbazepine (TRILEPTAL) 150 MG tablet Take 150 mg by mouth at bedtime. 06/11/19   [provider]  Oxcarbazepine (TRILEPTAL) 300 MG tablet Take 600 mg by mouth daily.    [provider]  pregabalin (LYRICA) 200 MG capsule Take 200 mg by mouth 3 (three) times daily. 06/14/19   [provider]  solifenacin (VESICARE) 5 MG tablet Take 5 mg by mouth daily.    [provider]    Family History Family History  Problem Relation Age of Onset   Hypertension Mother    Cancer Father    Heart failure Father    Diabetes Father    Hypertension Father     Social History Social History   Tobacco Use   Smoking status: Never   Smokeless tobacco: Never  Vaping Use   Vaping Use: Never used  Substance Use Topics   Alcohol use: Yes    Comment: rarely   Drug use: Not Currently     Allergies   Dust mite extract, Molds & smuts, Other, Quercus robur, Sulfa antibiotics, Tramadol, Amoxicillin-pot clavulanate, Bactrim [sulfamethoxazole-trimethoprim], Tramadol hcl, Bee pollen, and Pollen extract   Review of Systems Review of Systems  Genitourinary:  Positive for dysuria and hematuria.  All other systems reviewed and are negative.   Physical Exam Triage Vital Signs ED Triage Vitals  Enc Vitals Group     BP 08/23/20 1108 (!) 148/92     Pulse Rate 08/23/20 1108 75     Resp 08/23/20 1108 20     Temp 08/23/20 1108 (!) 97.5 F (36.4 C)     Temp Source 08/23/20 1108 Oral     SpO2  08/23/20 1108 100 %     Weight 08/23/20 1058 (!) 309 lb (140.2 kg)     Height 08/23/20 1058 5' 11.5" (1.816 m)     Head Circumference --      Peak Flow --      Pain Score 08/23/20 1057 5     Pain Loc --      Pain Edu? --      Excl. in GC? --    No data found.  Updated Vital Signs BP (!) 148/92 (BP Location: Right Arm)   Pulse 75   Temp (!) 97.5 F (36.4 C) (Oral)   Resp 20   Ht 5' 11.5" (1.816 m)   Wt (!) 309 lb (140.2 kg)   SpO2 100%   BMI 42.50 kg/m      Physical Exam Vitals and nursing note reviewed.  Constitutional:      General: She is not in  acute distress.    Appearance: Normal appearance. She is obese. She is not ill-appearing.  HENT:     Head: Normocephalic and atraumatic.     Mouth/Throat:     Mouth: Mucous membranes are moist.     Pharynx: Oropharynx is clear.  Eyes:     Extraocular Movements: Extraocular movements intact.     Conjunctiva/sclera: Conjunctivae normal.     Pupils: Pupils are equal, round, and reactive to light.  Cardiovascular:     Rate and Rhythm: Normal rate and regular rhythm.     Pulses: Normal pulses.     Heart sounds: Normal heart sounds.  Pulmonary:     Effort: Pulmonary effort is normal.     Breath sounds: Wheezing and rales present. No rhonchi.  Abdominal:     Tenderness: There is no right CVA tenderness or left CVA tenderness.  Musculoskeletal:        General: Normal range of motion.     Cervical back: Normal range of motion and neck supple. No tenderness.  Lymphadenopathy:     Cervical: No cervical adenopathy.  Skin:    General: Skin is warm and dry.  Neurological:     General: No focal deficit present.     Mental Status: She is alert and oriented to person, place, and time.  Psychiatric:        Mood and Affect: Mood normal.        Behavior: Behavior normal.     UC Treatments / Results  Labs (all labs ordered are listed, but only abnormal results are displayed) Labs Reviewed  POCT URINALYSIS DIP (MANUAL ENTRY) -  Abnormal; Notable for the following components:      Result Value   Color, UA orange (*)    Glucose, UA =100 (*)    Bilirubin, UA small (*)    Ketones, POC UA small (15) (*)    Spec Grav, UA >=1.030 (*)    Blood, UA trace-intact (*)    Protein Ur, POC =100 (*)    Nitrite, UA Positive (*)    All other components within normal limits    EKG   Radiology No results found.  Procedures Procedures (including critical care time)  Medications Ordered in UC Medications - No data to display  Initial Impression / Assessment and Plan / UC Course  I have reviewed the triage vital signs and the nursing notes.  Pertinent labs & imaging results that were available during my care of the patient were reviewed by me and considered in my medical decision making (see chart for details).     MDM: 1.  Acute cystitis with hematuria-Rx'd Macrobid, urine culture ordered, advised patient to take medication as directed to completion.  Encouraged patient increase daily water intake while taking this medication.  Advised patient we will follow-up with urine culture results once received.  Patient discharged home, hemodynamically stable. Final Clinical Impressions(s) / UC Diagnoses   Final diagnoses:  Acute cystitis with hematuria     Discharge Instructions      Advised patient to take medication as directed to completion.  Encouraged patient increase daily water intake while taking this medication.  Advised patient we will follow-up with urine culture results once received.     ED Prescriptions     Medication Sig Dispense Auth. Provider   nitrofurantoin, macrocrystal-monohydrate, (MACROBID) 100 MG capsule Take 1 capsule (100 mg total) by mouth 2 (two) times daily. 10 capsule Caroline Iha, FNP      PDMP not reviewed  this encounter.   Caroline Iha, FNP 08/23/20 1146

## 2020-08-26 LAB — URINE CULTURE
MICRO NUMBER:: 12185675
SPECIMEN QUALITY:: ADEQUATE

## 2020-09-21 ENCOUNTER — Other Ambulatory Visit: Payer: Self-pay | Admitting: Podiatry

## 2020-09-21 ENCOUNTER — Encounter: Payer: Self-pay | Admitting: Podiatry

## 2020-09-21 ENCOUNTER — Other Ambulatory Visit: Payer: Self-pay

## 2020-09-21 ENCOUNTER — Ambulatory Visit (INDEPENDENT_AMBULATORY_CARE_PROVIDER_SITE_OTHER): Payer: Managed Care, Other (non HMO)

## 2020-09-21 ENCOUNTER — Ambulatory Visit (INDEPENDENT_AMBULATORY_CARE_PROVIDER_SITE_OTHER): Payer: Managed Care, Other (non HMO) | Admitting: Podiatry

## 2020-09-21 DIAGNOSIS — M2141 Flat foot [pes planus] (acquired), right foot: Secondary | ICD-10-CM

## 2020-09-21 DIAGNOSIS — M2142 Flat foot [pes planus] (acquired), left foot: Secondary | ICD-10-CM

## 2020-09-21 DIAGNOSIS — M722 Plantar fascial fibromatosis: Secondary | ICD-10-CM | POA: Diagnosis not present

## 2020-09-21 MED ORDER — METHYLPREDNISOLONE 4 MG PO TBPK: ORAL_TABLET | ORAL | 0 refills | Status: DC

## 2020-09-21 MED ORDER — BETAMETHASONE SOD PHOS & ACET 6 (3-3) MG/ML IJ SUSP
3.0000 mg | Freq: Once | INTRAMUSCULAR | Status: AC
  Administered 2020-09-21: 3 mg via INTRA_ARTICULAR

## 2020-09-21 NOTE — Progress Notes (Signed)
   Subjective: 40 y.o. female presenting today for recurrent heel pain to the bilateral feet.  Patient states that she is on her feet for several hours throughout her work shift.  She is possibly considering a different job career but has not put as much stress on her feet.  She continues to take meloxicam daily with minimal improvement   Past Medical History:  Diagnosis Date   ADD (attention deficit disorder)    Anxiety    Chronic pain    Depression      Objective: Physical Exam General: The patient is alert and oriented x3 in no acute distress.  Dermatology: Skin is warm, dry and supple bilateral lower extremities. Negative for open lesions or macerations bilateral.   Vascular: Dorsalis Pedis and Posterior Tibial pulses palpable bilateral.  Capillary fill time is immediate to all digits.  Neurological: Epicritic and protective threshold intact bilateral.   Musculoskeletal: Tenderness to palpation to the plantar aspect of the bilateral heels along the plantar fascia. All other joints range of motion within normal limits bilateral. Strength 5/5 in all groups bilateral.  Pes planus noted  Radiographic exam: Normal osseous mineralization. Joint spaces preserved. No fracture/dislocation/boney destruction. No other soft tissue abnormalities or radiopaque foreign bodies.  Pes planus deformity with collapse of medial longitudinal arch noted  Assessment: 1. plantar fasciitis bilateral feet 2.  Pes planus bilateral  Plan of Care:  1. Patient evaluated. Xrays reviewed.   2. Injection of 0.5cc Celestone soluspan injected into the bilateral heels.  3. Rx for Medrol Dose Pak placed 4.  Resume meloxicam after completion of the Dosepak 5.  Patient has tried custom molded orthotics in the past but they seem to aggravate her feet more than helpful.  Discontinue. 6. Instructed patient regarding therapies and modalities at home to alleviate symptoms.  Patient has resumed physical therapy.   Continue. 7. Return to clinic in 6 weeks  *Works for [employer].  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 Felecia Shelling, DPM    [address] [place], [place] [address]                Office [phone]  Fax [phone]

## 2020-11-02 ENCOUNTER — Ambulatory Visit (INDEPENDENT_AMBULATORY_CARE_PROVIDER_SITE_OTHER): Payer: Managed Care, Other (non HMO) | Admitting: Podiatry

## 2020-11-02 ENCOUNTER — Other Ambulatory Visit: Payer: Self-pay

## 2020-11-02 DIAGNOSIS — M722 Plantar fascial fibromatosis: Secondary | ICD-10-CM | POA: Diagnosis not present

## 2020-11-02 DIAGNOSIS — M2142 Flat foot [pes planus] (acquired), left foot: Secondary | ICD-10-CM | POA: Diagnosis not present

## 2020-11-02 DIAGNOSIS — M2141 Flat foot [pes planus] (acquired), right foot: Secondary | ICD-10-CM

## 2020-11-15 NOTE — Progress Notes (Signed)
   Subjective: 40 y.o. female presenting today for follow-up evaluation of heel pain to the bilateral feet.  She says that she is feeling much better.  She said that she had to discontinue taking the meloxicam.  She also says that physical therapy helped significantly.  She presents for further treatment and evaluation   Past Medical History:  Diagnosis Date   ADD (attention deficit disorder)    Anxiety    Chronic pain    Depression      Objective: Physical Exam General: The patient is alert and oriented x3 in no acute distress.  Dermatology: Skin is warm, dry and supple bilateral lower extremities. Negative for open lesions or macerations bilateral.   Vascular: Dorsalis Pedis and Posterior Tibial pulses palpable bilateral.  Capillary fill time is immediate to all digits.  Neurological: Epicritic and protective threshold intact bilateral.   Musculoskeletal: Negative for any significant tenderness to palpation to the plantar aspect of the bilateral heels along the plantar fascia. All other joints range of motion within normal limits bilateral. Strength 5/5 in all groups bilateral.  Pes planus noted  Radiographic exam 09/21/2020 bilateral: Normal osseous mineralization. Joint spaces preserved. No fracture/dislocation/boney destruction. No other soft tissue abnormalities or radiopaque foreign bodies.  Pes planus deformity with collapse of medial longitudinal arch noted  Assessment: 1. plantar fasciitis bilateral feet 2.  Pes planus bilateral  Plan of Care:  1. Patient evaluated.  2.  Overall the patient is doing much better.  Patient states that she has minimal pain at all especially to the right foot.  She can rates the pain as 1/10.  She said the physical therapy helped significantly.  She cannot take the meloxicam secondary to history of stomach ulcers. 3.  The patient may now resume full activity no restrictions 4.  Return to clinic as needed  *Works for [employer].  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 Felecia Shelling, DPM    [address] [place], [place] [address]                Office [phone]  Fax [phone]

## 2020-11-23 ENCOUNTER — Ambulatory Visit (INDEPENDENT_AMBULATORY_CARE_PROVIDER_SITE_OTHER): Payer: Managed Care, Other (non HMO) | Admitting: Podiatry

## 2020-11-23 ENCOUNTER — Other Ambulatory Visit: Payer: Self-pay

## 2020-11-23 DIAGNOSIS — L600 Ingrowing nail: Secondary | ICD-10-CM

## 2020-11-23 DIAGNOSIS — L819 Disorder of pigmentation, unspecified: Secondary | ICD-10-CM | POA: Diagnosis not present

## 2020-11-23 NOTE — Patient Instructions (Signed)

## 2020-11-25 NOTE — Progress Notes (Signed)
Subjective: 40 year old female presents the office for concerns of reoccurrence of ingrown toenail right big toe worse on the left which is tender with pressure.  Most on the medial aspect she does get discomfort on both nail borders.  She also has a hyperpigmented lesion to the distal aspect of the right big toe.  She says been there for several years but has gotten somewhat bigger upon asking her about it.  No ulcerations.  Objective: AAO x3, NAD DP/PT pulses palpable bilaterally, CRT less than 3 seconds There is incurvation present right hallux toenail but the medial lateral aspects of the toenail.  Localized edema but there is no erythema or warmth.  No drainage or pus.  At the distal aspect of the hallux is a hyperpigmented lesion with asymmetrical borders.  No ulcerations.  Is flat.  No edema, erythema or any pain.  Mild incurvation of left hallux toenail without any edema, erythema or signs of infection. No edema, erythema, increase in warmth to bilateral lower extremities.  No open lesions or pre-ulcerative lesions.  No pain with calf compression, swelling, warmth, erythema  Assessment: Ingrown toenail right hallux with hyperpigmented lesion  Plan: -All treatment options discussed with the patient including all alternatives, risks, complications.  -At this time, the patient is requesting partial nail removal with chemical matricectomy to the symptomatic portion of the nail.  Also I discussed with her and recommended biopsy of the hyperpigmented lesion at the same time given that it has changed.  Risks and complications were discussed with the patient for which they understand and written consent was obtained. Under sterile conditions a total of 3 mL of a mixture of 2% lidocaine plain and 0.5% Marcaine plain was infiltrated in a hallux block fashion. Once anesthetized, the skin was prepped in sterile fashion. A tourniquet was then applied.  I utilized a 3 mm punch biopsy in order to remove a  wedge of tissue from the hyperpigmented lesion.  This was sent to pathology.  Next the medial and lateral aspect of hallux nail border was then sharply excised making sure to remove the entire offending nail border. Once the nails were ensured to be removed area was debrided and the underlying skin was intact. There is no purulence identified in the procedure. Next phenol was then applied under standard conditions and copiously irrigated. Silvadene was applied. A dry sterile dressing was applied. After application of the dressing the tourniquet was removed and there is found to be an immediate capillary refill time to the digit. The patient tolerated the procedure well any complications. Post procedure instructions were discussed the patient for which he verbally understood. Follow-up in one week for nail check or sooner if any problems are to arise. Discussed signs/symptoms of infection and directed to call the office immediately should any occur or go directly to the emergency room. In the meantime, encouraged to call the office with any questions, concerns, changes symptoms. -Debrided the left hallux nail with any complications or bleeding.  If it remains symptomatic discussed partial nail avulsion as well. -Patient encouraged to call the office with any questions, concerns, change in symptoms.   Vivi Barrack DPM

## 2020-12-01 ENCOUNTER — Other Ambulatory Visit: Payer: Self-pay

## 2020-12-01 ENCOUNTER — Ambulatory Visit (INDEPENDENT_AMBULATORY_CARE_PROVIDER_SITE_OTHER): Payer: Managed Care, Other (non HMO) | Admitting: Podiatry

## 2020-12-01 ENCOUNTER — Encounter: Payer: Self-pay | Admitting: Podiatry

## 2020-12-01 DIAGNOSIS — L819 Disorder of pigmentation, unspecified: Secondary | ICD-10-CM

## 2020-12-01 DIAGNOSIS — L600 Ingrowing nail: Secondary | ICD-10-CM

## 2020-12-01 MED ORDER — CEPHALEXIN 500 MG PO CAPS: 500.0000 mg | ORAL_CAPSULE | Freq: Three times a day (TID) | ORAL | 2 refills | Status: DC

## 2020-12-01 NOTE — Progress Notes (Signed)
Subjective: 40 year old female presents the office today for Pap evaluation after undergoing partial nail avulsion of the right hallux toenail as well as punch biopsy of her hyperpigmented skin lesion.  States that she still wearing surgical shoe.  Having some tenderness.  She noticed some green drainage but she is not sure if it is from the ointment she is put on with actual drainage.  No swelling or redness.  No fevers or chills.  No other concerns.  Objective: AAO x3, NAD DP/PT pulses palpable bilaterally, CRT less than 3 seconds Status post partial nail avulsion to the right medial, lateral hallux nail border.  There is minimal amount of clear drainage but there is no pus coming from it.  Not able to identify any green drainage and there is no clear drainage on the bandage.  Only small amount of bloody drainage on the bandage.  Smaller granulation tissue still present along the nail borders.  There is no ascending cellulitis.  Punch biopsy of the distal portion of the toe with granular tissue.  No drainage coming from this area. No open lesions or pre-ulcerative lesions.  No pain with calf compression, swelling, warmth, erythema  Assessment: Status post partial nail avulsion, punch biopsy right hallux  Plan: -All treatment options discussed with the patient including all alternatives, risks, complications.  -This point I recommend continue soaking Epson salts covered with a small amount of antibiotic ointment and dressing daily.  Will start Keflex.  There is any worsening signs or symptoms of infection or drainage from the nail. -Awaiting biopsy result. -If the left hallux continues to be ingrown or causing issues discussed partial nail avulsion but currently asymptomatic. -Patient encouraged to call the office with any questions, concerns, change in symptoms.   RTC 2-3 weeks or sooner if needed  Vivi Barrack DPM

## 2020-12-04 ENCOUNTER — Telehealth: Payer: Self-pay | Admitting: *Deleted

## 2020-12-04 NOTE — Telephone Encounter (Addendum)
Sherri w/ [facility] [phone] calling because the pathologist would like to verify the sample sent on 11/23/20,what was the size of the lesion and were there any clinical pictures taken?Please advise.

## 2020-12-07 ENCOUNTER — Encounter: Payer: Self-pay | Admitting: Podiatry

## 2020-12-21 ENCOUNTER — Other Ambulatory Visit: Payer: Self-pay

## 2020-12-21 ENCOUNTER — Emergency Department (INDEPENDENT_AMBULATORY_CARE_PROVIDER_SITE_OTHER)
Admission: EM | Admit: 2020-12-21 | Discharge: 2020-12-21 | Disposition: A | Discharge status: Home / Self Care | Payer: Managed Care, Other (non HMO) | Source: Home / Self Care

## 2020-12-21 DIAGNOSIS — J029 Acute pharyngitis, unspecified: Secondary | ICD-10-CM | POA: Diagnosis not present

## 2020-12-21 LAB — POC INFLUENZA A AND B ANTIGEN (URGENT CARE ONLY)
Influenza A Ag: NEGATIVE
Influenza B Ag: NEGATIVE

## 2020-12-21 MED ORDER — ONDANSETRON 4 MG PO TBDP
4.0000 mg | ORAL_TABLET | Freq: Once | ORAL | Status: AC
  Administered 2020-12-21: 17:00:00 4 mg via ORAL

## 2020-12-21 NOTE — Discharge Instructions (Addendum)
Advised patient may use OTC Tylenol 500 to 1000 mg 1-2 times daily, as needed for sore throat pain.  Advised patient we will follow-up with throat culture results once received.

## 2020-12-21 NOTE — ED Triage Notes (Signed)
Pt c/o sore throat, cough and headache that started yesterday. Unsure about fever but started having chills today. Tylenol cold and flu prn.

## 2020-12-21 NOTE — ED Provider Notes (Signed)
[facility]    CSN: [id number] Arrival date & time: 12/21/20  1447      History   Chief Complaint Chief Complaint  Patient presents with   Sore Throat    IN CAR   Cough   Headache    HPI Caroline Nelson is a 40 y.o. female.   HPI 40 year old female presents with sore throat, cough and headache that started yesterday.  Patient reports having chills today.  Influenza was negative.  PMH significant for allergic rhinitis and seasonal allergy.  Past Medical History:  Diagnosis Date   ADD (attention deficit disorder)    Anxiety    Chronic pain    Depression     Patient Active Problem List   Diagnosis Date Noted   Overactive bladder 07/14/2020   Spinal stenosis 07/14/2020   Current severe episode of major depressive disorder without psychotic features ([place]) 11/14/2019   Pre-diabetes 04/04/2017   Chronic midline low back pain with bilateral sciatica 09/01/2016   Recurrent UTI 01/02/2015   Allergic rhinoconjunctivitis of both eyes 01/01/2015   Mild persistent asthma, uncomplicated [id number]   Chondromalacia of both patellae 09/04/2014   Chronic pain of both knees 08/18/2014   Pain of both sacroiliac joints 08/18/2014   Surveillance of (intrauterine) contraceptive device 06/06/2014   Seasonal allergies 06/07/2013   Chronic pain syndrome 04/01/2013   Arthropathy of lumbar facet joint 04/01/2013   Recurrent major depressive disorder, in partial remission ([place]) 12/06/2012   History of bariatric surgery 12/06/2012   OSA (obstructive sleep apnea) 10/04/2012   Seborrheic dermatitis of scalp 09/18/2012   Intractable chronic migraine without aura 04/19/2012   Anxiety 05/02/2011   Depression 10/22/2010   MIGRAINE HEADACHE 11/18/2008   BENIGN POSITIONAL VERTIGO 11/18/2008   POLYCYSTIC OVARIES 10/28/2008   ALLERGIC RHINITIS CAUSE UNSPECIFIED 06/02/2008   URINARY TRACT INFECTION, RECURRENT 03/25/2008   OBESITY, UNSPECIFIED 12/14/2006   ESSENTIAL HYPERTENSION,  BENIGN 12/14/2006   [place] DISEASE, LUMBAR 12/14/2006    Past Surgical History:  Procedure Laterality Date   HERNIA REPAIR     LAPAROSCOPIC GASTRIC SLEEVE RESECTION     OVARIAN CYST REMOVAL Right     OB History   No obstetric history on file.      Home Medications    Prior to Admission medications   Medication Sig Start Date End Date Taking? Authorizing Provider  acetaminophen (TYLENOL) 500 MG tablet Take by mouth. 12/06/18   [provider]  albuterol (VENTOLIN HFA) 108 (90 Base) MCG/ACT inhaler Inhale into the lungs. 07/21/13   [provider]  amLODipine (NORVASC) 5 MG tablet Take 5 mg by mouth daily. 10/21/20   [provider]  atomoxetine (STRATTERA) 40 MG capsule Take by mouth. 07/24/20   [provider]  azelastine (ASTELIN) 0.1 % nasal spray SMARTSIG:2 Spray(s) Both Nares Twice Daily PRN 06/15/19   [provider]  azelastine (ASTELIN) 0.1 % nasal spray 2 sprays 2 (two) times daily. 07/13/19   [provider]  azelastine (ASTELIN) 0.1 % nasal spray Place into the nose. 07/22/20 07/22/21  [provider]  buprenorphine (BUTRANS) 7.5 MCG/HR 1 patch once a week. 06/02/20   [provider]  buPROPion (WELLBUTRIN XL) 300 MG 24 hr tablet Take 300 mg by mouth daily. 06/16/19   [provider]  buPROPion (WELLBUTRIN) 75 MG tablet Take 75 mg by mouth every morning. 10/19/20   [provider]  cephALEXin (KEFLEX) 500 MG capsule Take 500 mg by mouth 2 (two) times daily. 09/18/20  [provider]  cephALEXin (KEFLEX) 500 MG capsule Take 1 capsule (500 mg total) by mouth 3 (three) times daily. 12/01/20   Vivi Barrack, DPM  doxepin (SINEQUAN) 10 MG capsule Take by mouth. 11/21/20   [provider]  EPINEPHrine 0.3 mg/0.3 mL IJ SOAJ injection SMARTSIG:0.3 Milliliter(s) IM Daily PRN 05/27/20   [provider]  fluticasone Aleda Grana) 50 MCG/ACT nasal spray  04/18/19   [provider]  gentamicin cream (GARAMYCIN) 0.1 % Apply 1 application topically 2 (two) times daily. 06/15/20   Felecia Shelling, DPM  ipratropium (ATROVENT) 0.03 % nasal spray  04/18/19   [provider]  LATUDA 40 MG TABS tablet Take 40 mg by mouth at bedtime. 11/14/20   [provider]  lisinopril (ZESTRIL) 40 MG tablet Take 40 mg by mouth daily. 04/22/19   [provider]  lisinopril (ZESTRIL) 40 MG tablet SMARTSIG:1 Tablet(s) By Mouth Daily 07/12/19   [provider]  medroxyPROGESTERone (DEPO-PROVERA) 150 MG/ML injection Inject into the muscle. 02/16/17   [provider]  meloxicam (MOBIC) 15 MG tablet TAKE 1 TABLET (15 MG TOTAL) BY MOUTH DAILY. 09/21/20   Felecia Shelling, DPM  methocarbamol (ROBAXIN) 500 MG tablet Take by mouth. 11/03/20 03/03/21  [provider]  methylPREDNISolone (MEDROL DOSEPAK) 4 MG TBPK tablet 6 day dose pack - take as directed 09/21/20   Felecia Shelling, DPM  metoprolol tartrate (LOPRESSOR) 50 MG tablet TAKE 1 TABLET(50 MG) BY MOUTH TWICE DAILY 11/17/20   [provider]  montelukast (SINGULAIR) 10 MG tablet Take 10 mg by mouth daily. 09/03/20   [provider]  naloxone [facility]) nasal spray 4 mg/0.1 mL Place into the nose. 08/13/19   [provider]  naltrexone (DEPADE) 50 MG tablet Take 25 mg by mouth daily. 02/26/20   [provider]  nitrofurantoin, macrocrystal-monohydrate, (MACROBID) 100 MG capsule Take 1 capsule (100 mg total) by mouth 2 (two) times daily. 08/23/20   Trevor Iha, FNP  NON FORMULARY KETAMINE treatment for depression--unsure of dose    [provider]  omeprazole (PRILOSEC) 40 MG capsule Take 40 mg by mouth 2 (two) times daily. 10/15/20   [provider]  OXcarbazepine (TRILEPTAL) 150 MG tablet Take 150 mg by mouth at bedtime. 06/11/19   [provider]  Oxcarbazepine (TRILEPTAL) 300 MG tablet Take 600 mg by mouth daily.    [provider]  pregabalin (LYRICA) 200 MG capsule Take 200 mg by mouth 3 (three) times daily. 06/14/19   [provider]  pregabalin (LYRICA) 25 MG capsule Take by mouth. 08/13/20   [provider]  solifenacin (VESICARE) 5 MG tablet Take 1 tablet by mouth daily. 09/29/20   [provider]  traZODone (DESYREL) 50 MG tablet Take by mouth at bedtime as needed. 11/13/20   [provider]    Family History Family History  Problem Relation Age of Onset   Hypertension Mother    Cancer Father    Heart failure Father    Diabetes Father    Hypertension Father     Social History Social History   Tobacco Use   Smoking status: Never   Smokeless tobacco: Never  Vaping Use   Vaping Use: Never used  Substance Use Topics   Alcohol use: Yes    Comment: rarely   Drug use: Not Currently     Allergies   Dust mite extract, Molds & smuts, Other, Quercus robur, Sulfa antibiotics, Tramadol, Amoxicillin-pot clavulanate, Bactrim [  sulfamethoxazole-trimethoprim], Tramadol hcl, Bee pollen, and Pollen extract   Review of Systems Review of Systems  HENT:  Positive for sore throat.   Respiratory:  Positive for cough.   Neurological:  Positive for headaches.  All other systems reviewed and are negative.   Physical Exam Triage Vital Signs ED Triage Vitals  Enc Vitals Group     BP 12/21/20 1657 (!) 171/101     Pulse Rate 12/21/20 1657 72     Resp 12/21/20 1657 14     Temp 12/21/20 1657 98.9 F (37.2 C)     Temp Source 12/21/20 1657 Oral     SpO2 12/21/20 1657 98 %     Weight --      Height --      Head Circumference --      Peak Flow --      Pain Score 12/21/20 1536 10     Pain Loc --      Pain Edu? --      Excl. in [place]? --    No data found.  Updated Vital Signs BP (!) 171/101 (BP Location: Left Arm)   Pulse 72   Temp 98.9 F (37.2 C) (Oral)   Resp 14   LMP 12/14/2020 (Approximate)   SpO2 98%   Physical Exam Vitals and nursing note reviewed.   Constitutional:      General: She is not in acute distress.    Appearance: She is well-developed. She is obese. She is not ill-appearing.  HENT:     Head: Normocephalic and atraumatic.     Right Ear: Tympanic membrane and ear canal normal.     Left Ear: Tympanic membrane and ear canal normal.     Mouth/Throat:     Mouth: Mucous membranes are moist.     Pharynx: Oropharynx is clear. Uvula midline.     Comments: Mild amount of clear drainage of posterior oropharynx noted. Eyes:     Conjunctiva/sclera: Conjunctivae normal.     Pupils: Pupils are equal, round, and reactive to light.  Cardiovascular:     Rate and Rhythm: Normal rate and regular rhythm.     Heart sounds: Normal heart sounds.  Pulmonary:     Effort: Pulmonary effort is normal.     Breath sounds: Normal breath sounds.  Musculoskeletal:     Cervical back: Normal range of motion and neck supple.  Skin:    General: Skin is warm and dry.  Neurological:     General: No focal deficit present.     Mental Status: She is alert and oriented to person, place, and time.     UC Treatments / Results  Labs (all labs ordered are listed, but only abnormal results are displayed) Labs Reviewed  CULTURE, GROUP A STREP  POC INFLUENZA A AND B ANTIGEN (URGENT CARE ONLY)    EKG   Radiology No results found.  Procedures Procedures (including critical care time)  Medications Ordered in UC Medications  ondansetron (ZOFRAN-ODT) disintegrating tablet 4 mg (4 mg Oral Given 12/21/20 1719)    Initial Impression / Assessment and Plan / UC Course  I have reviewed the triage vital signs and the nursing notes.  Pertinent labs & imaging results that were available during my care of the patient were reviewed by me and considered in my medical decision making (see chart for details).     MDM: 1.  Sore throat-Advised patient may use OTC Tylenol 500 to 1000 mg 1-2 times daily, as needed for sore throat  pain.  Advised patient we will  follow-up with throat culture results once received.  Patient discharged home, hemodynamically stable.  Final Clinical Impressions(s) / UC Diagnoses   Final diagnoses:  Sore throat  Acute pharyngitis, unspecified etiology     Discharge Instructions      Advised patient may use OTC Tylenol 500 to 1000 mg 1-2 times daily, as needed for sore throat pain.  Advised patient we will follow-up with throat culture results once received.     ED Prescriptions   None    PDMP not reviewed this encounter.   Eliezer Lofts, [place] 12/21/20 1819

## 2020-12-24 LAB — CULTURE, GROUP A STREP: Strep A Culture: NEGATIVE

## 2021-05-02 ENCOUNTER — Emergency Department
Admission: EM | Admit: 2021-05-02 | Discharge: 2021-05-02 | Disposition: A | Discharge status: Home / Self Care | Payer: Managed Care, Other (non HMO) | Source: Home / Self Care

## 2021-05-02 ENCOUNTER — Encounter: Payer: Self-pay | Admitting: Emergency Medicine

## 2021-05-02 ENCOUNTER — Other Ambulatory Visit: Payer: Self-pay

## 2021-05-02 DIAGNOSIS — N3000 Acute cystitis without hematuria: Secondary | ICD-10-CM

## 2021-05-02 LAB — POCT URINALYSIS DIP (MANUAL ENTRY)
Bilirubin, UA: NEGATIVE
Glucose, UA: NEGATIVE mg/dL
Ketones, POC UA: NEGATIVE mg/dL
Nitrite, UA: POSITIVE — AB
Protein Ur, POC: NEGATIVE mg/dL
Spec Grav, UA: 1.02 (ref 1.010–1.025)
Urobilinogen, UA: 0.2 E.U./dL
pH, UA: 7 (ref 5.0–8.0)

## 2021-05-02 MED ORDER — FLUCONAZOLE 150 MG PO TABS: 150.0000 mg | ORAL_TABLET | Freq: Every day | ORAL | 0 refills | Status: DC

## 2021-05-02 MED ORDER — NITROFURANTOIN MONOHYD MACRO 100 MG PO CAPS: 100.0000 mg | ORAL_CAPSULE | Freq: Two times a day (BID) | ORAL | 0 refills | Status: DC

## 2021-05-02 NOTE — ED Triage Notes (Signed)
Dysuria intermittent x 1 week  ?Left flank pain since Monday - intermittent  ?Drinking cranberry juice  ?Denies fever  ?OTC tylenol & motrin - none today  ?Hx of kidney stones - no surgery ?  ?

## 2021-05-02 NOTE — ED Provider Notes (Signed)
[facility]    CSN: [id number] Arrival date & time: 05/02/21  1209      History   Chief Complaint Chief Complaint  Patient presents with   Dysuria    HPI Caroline Nelson is a 41 y.o. female.   HPI Caroline Nelson is a 41 y.o. female presents for evaluation of urinary frequency, urgency and dysuria x 1 week, with left  flank pain, fever, chills, or abnormal vaginal discharge or bleeding. Reports no concern for STD. Previous history of UTI and reports failure with bacterial growth of E. Coli per EMR. Patient's last menstrual period was 04/24/2021 (approximate).  Past Medical History:  Diagnosis Date   ADD (attention deficit disorder)    Anxiety    Chronic pain    Depression     Patient Active Problem List   Diagnosis Date Noted   Overactive bladder 07/14/2020   Spinal stenosis 07/14/2020   Current severe episode of major depressive disorder without psychotic features (HCC) 11/14/2019   Pre-diabetes 04/04/2017   Chronic midline low back pain with bilateral sciatica 09/01/2016   Recurrent UTI 01/02/2015   Allergic rhinoconjunctivitis of both eyes 01/01/2015   Mild persistent asthma, uncomplicated 01/01/2015   Chondromalacia of both patellae 09/04/2014   Chronic pain of both knees 08/18/2014   Pain of both sacroiliac joints 08/18/2014   Surveillance of (intrauterine) contraceptive device 06/06/2014   Seasonal allergies 06/07/2013   Chronic pain syndrome 04/01/2013   Arthropathy of lumbar facet joint 04/01/2013   Recurrent major depressive disorder, in partial remission (HCC) 12/06/2012   History of bariatric surgery 12/06/2012   OSA (obstructive sleep apnea) 10/04/2012   Seborrheic dermatitis of scalp 09/18/2012   Intractable chronic migraine without aura 04/19/2012   Anxiety 05/02/2011   Depression 10/22/2010   MIGRAINE HEADACHE 11/18/2008   BENIGN POSITIONAL VERTIGO 11/18/2008   POLYCYSTIC OVARIES 10/28/2008   ALLERGIC RHINITIS CAUSE UNSPECIFIED  06/02/2008   URINARY TRACT INFECTION, RECURRENT 03/25/2008   OBESITY, UNSPECIFIED 12/14/2006   ESSENTIAL HYPERTENSION, BENIGN 12/14/2006   DISC DISEASE, LUMBAR 12/14/2006    Past Surgical History:  Procedure Laterality Date   HERNIA REPAIR     LAPAROSCOPIC GASTRIC SLEEVE RESECTION     OVARIAN CYST REMOVAL Right     OB History   No obstetric history on file.      Home Medications    Prior to Admission medications   Medication Sig Start Date End Date Taking? Authorizing Provider  acetaminophen (TYLENOL) 500 MG tablet Take by mouth. 12/06/18   [provider]  albuterol (VENTOLIN HFA) 108 (90 Base) MCG/ACT inhaler Inhale into the lungs. 07/21/13   [provider]  amLODipine (NORVASC) 5 MG tablet Take 5 mg by mouth daily. Patient not taking: Reported on 05/02/2021 10/21/20   [provider]  atomoxetine (STRATTERA) 40 MG capsule Take by mouth. Patient not taking: Reported on 05/02/2021 07/24/20   [provider]  AUVELITY 45-105 MG TBCR Take by mouth. 03/17/21   [provider]  azelastine (ASTELIN) 0.1 % nasal spray SMARTSIG:2 Spray(s) Both Nares Twice Daily PRN 06/15/19   [provider]  azelastine (ASTELIN) 0.1 % nasal spray 2 sprays 2 (two) times daily. Patient not taking: Reported on 05/02/2021 07/13/19   [provider]  azelastine (ASTELIN) 0.1 % nasal spray Place into the nose. 07/22/20 07/22/21  [provider]  buprenorphine (BUTRANS) 7.5 MCG/HR 1 patch once a week. 06/02/20   [provider]  buPROPion (WELLBUTRIN XL) 300 MG 24 hr  tablet Take 300 mg by mouth daily. Patient not taking: Reported on 05/02/2021 06/16/19   [provider]  buPROPion (WELLBUTRIN) 75 MG tablet Take 75 mg by mouth every morning. Patient not taking: Reported on 05/02/2021 10/19/20   [provider]  cephALEXin (KEFLEX) 500 MG capsule Take 500 mg by mouth 2 (two) times daily. Patient not taking: Reported on 05/02/2021  09/18/20   [provider]  cephALEXin (KEFLEX) 500 MG capsule Take 1 capsule (500 mg total) by mouth 3 (three) times daily. Patient not taking: Reported on 05/02/2021 12/01/20   Vivi Barrack, DPM  Cholecalciferol (VITAMIN D-3) 25 MCG (1000 UT) CAPS 1 capsule    [provider]  doxepin (SINEQUAN) 10 MG capsule Take by mouth. Patient not taking: Reported on 05/02/2021 11/21/20   [provider]  EPINEPHrine 0.3 mg/0.3 mL IJ SOAJ injection SMARTSIG:0.3 Milliliter(s) IM Daily PRN 05/27/20   [provider]  Fluocinolone Acetonide Body 0.01 % OIL Apply topically. 04/21/21   [provider]  fluticasone Aleda Grana) 50 MCG/ACT nasal spray  04/18/19   [provider]  gentamicin cream (GARAMYCIN) 0.1 % Apply 1 application topically 2 (two) times daily. Patient not taking: Reported on 05/02/2021 06/15/20   Felecia Shelling, DPM  HYDROcodone-acetaminophen (NORCO) 7.5-325 MG tablet Take 1 tablet by mouth 3 (three) times daily as needed. 04/22/21   [provider]  ipratropium (ATROVENT) 0.03 % nasal spray  04/18/19   [provider]  LATUDA 40 MG TABS tablet Take 40 mg by mouth at bedtime. Patient not taking: Reported on 05/02/2021 11/14/20   [provider]  lisinopril (ZESTRIL) 40 MG tablet Take 40 mg by mouth daily. Patient not taking: Reported on 05/02/2021 04/22/19   [provider]  lisinopril (ZESTRIL) 40 MG tablet SMARTSIG:1 Tablet(s) By Mouth Daily 07/12/19   [provider]  medroxyPROGESTERone (DEPO-PROVERA) 150 MG/ML injection Inject into the muscle. Patient not taking: Reported on 05/02/2021 02/16/17   [provider]  meloxicam (MOBIC) 15 MG tablet TAKE 1 TABLET (15 MG TOTAL) BY MOUTH DAILY. Patient not taking: Reported on 05/02/2021 09/21/20   Felecia Shelling, DPM  methylPREDNISolone (MEDROL DOSEPAK) 4 MG TBPK tablet 6 day dose pack - take as directed Patient not taking: Reported on 05/02/2021 09/21/20    Felecia Shelling, DPM  metoprolol tartrate (LOPRESSOR) 50 MG tablet TAKE 1 TABLET(50 MG) BY MOUTH TWICE DAILY Patient not taking: Reported on 05/02/2021 11/17/20   [provider]  montelukast (SINGULAIR) 10 MG tablet Take 10 mg by mouth daily. Patient not taking: Reported on 05/02/2021 09/03/20   [provider]  naloxone [facility]) nasal spray 4 mg/0.1 mL Place into the nose. 08/13/19   [provider]  naltrexone (DEPADE) 50 MG tablet Take 25 mg by mouth daily. Patient not taking: Reported on 05/02/2021 02/26/20   [provider]  nitrofurantoin, macrocrystal-monohydrate, (MACROBID) 100 MG capsule Take 1 capsule (100 mg total) by mouth 2 (two) times daily. Patient not taking: Reported on 05/02/2021 08/23/20   Trevor Iha, FNP  NON FORMULARY KETAMINE treatment for depression--unsure of dose    [provider]  omeprazole (PRILOSEC) 40 MG capsule Take 40 mg by mouth 2 (two) times daily. 10/15/20   [provider]  orphenadrine (NORFLEX) 100 MG tablet Take 100 mg by mouth 2 (two) times daily as needed. 04/22/21   [provider]  OXcarbazepine (TRILEPTAL) 150 MG tablet Take 150 mg by mouth at bedtime. 06/11/19   [provider]  Oxcarbazepine (TRILEPTAL) 300 MG tablet Take 600 mg by mouth daily.    [provider]  pregabalin (LYRICA) 200 MG capsule Take 200 mg by mouth 3 (three) times daily. Patient not taking: Reported on 05/02/2021 06/14/19   [provider]  pregabalin (LYRICA) 25 MG capsule Take by mouth. Patient not taking: Reported on 05/02/2021 08/13/20   [provider]  propranolol ER (INDERAL LA) 80 MG 24 hr capsule Take 80 mg by mouth daily. 04/05/21   [provider]  Saccharomyces boulardii (PROBIOTIC) 250 MG CAPS See admin instructions.    [provider]  solifenacin (VESICARE) 5 MG tablet Take 1 tablet by mouth daily. 09/29/20   [provider]  traZODone (DESYREL) 50 MG tablet  Take by mouth at bedtime as needed. 11/13/20   [provider]    Family History Family History  Problem Relation Age of Onset   Hypertension Mother    Cancer Father    Heart failure Father    Diabetes Father    Hypertension Father     Social History Social History   Tobacco Use   Smoking status: Never   Smokeless tobacco: Never  Vaping Use   Vaping Use: Never used  Substance Use Topics   Alcohol use: Yes    Comment: rarely   Drug use: Not Currently     Allergies   Dust mite extract, Molds & smuts, Other, Quercus robur, Sulfa antibiotics, Tramadol, Amoxicillin-pot clavulanate, Bactrim [sulfamethoxazole-trimethoprim], Tramadol hcl, Bee pollen, and Pollen extract   Review of Systems Review of Systems Pertinent negatives listed in HPI   Physical Exam Triage Vital Signs ED Triage Vitals  Enc Vitals Group     BP 05/02/21 1241 (!) 153/105     Pulse Rate 05/02/21 1241 76     Resp 05/02/21 1241 16     Temp 05/02/21 1241 99 F (37.2 C)     Temp Source 05/02/21 1241 Oral     SpO2 05/02/21 1241 100 %     Weight 05/02/21 1245 284 lb (128.8 kg)     Height 05/02/21 1245 5' 11.5" (1.816 m)     Head Circumference --      Peak Flow --      Pain Score 05/02/21 1244 3     Pain Loc --      Pain Edu? --      Excl. in GC? --    No data found.  Updated Vital Signs BP (!) 153/105 (BP Location: Right Arm) Comment: hx of HTN - on meds- took at 1100 -later than usual  Pulse 76   Temp 99 F (37.2 C) (Oral)   Resp 16   Ht 5' 11.5" (1.816 m)   Wt 284 lb (128.8 kg)   LMP 04/24/2021 (Approximate)   SpO2 100%   BMI 39.06 kg/m   Visual Acuity Right Eye Distance:   Left Eye Distance:   Bilateral Distance:    Right Eye Near:   Left Eye Near:    Bilateral Near:     Physical Exam General appearance: alert, well developed, well nourished, cooperative and in no distress Head: Normocephalic, without obvious abnormality, atraumatic Respiratory: Respirations even and  unlabored, normal respiratory rate Heart: Rate and rhythm normal.   CVA: + left flank tenderness Extremities: No gross deformities Skin: Skin color, texture, turgor normal. No rashes seen  Psych: Appropriate mood and affect.   UC Treatments / Results  Labs (all labs ordered are listed, but only abnormal  results are displayed) Labs Reviewed - No data to display  EKG   Radiology No results found.  Procedures Procedures (including critical care time)  Medications Ordered in UC Medications - No data to display  Initial Impression / Assessment and Plan / UC Course  I have reviewed the triage vital signs and the nursing notes.  Pertinent labs & imaging results that were available during my care of the patient were reviewed by me and considered in my medical decision making (see chart for details).      UA abnormal and findings consistent with UTI.  Empiric antibiotic treatment initiated with Macrobid twice daily for 10 days given patient has positive nitrites. Encouraged increase intake of water. Urine culture pending.  ER if symptoms become severe. Follow-up with PCP if symptoms do not completely resolve.  Final Clinical Impressions(s) / UC Diagnoses   Final diagnoses:  Acute cystitis without hematuria   Discharge Instructions   None    ED Prescriptions     Medication Sig Dispense Auth. Provider   nitrofurantoin, macrocrystal-monohydrate, (MACROBID) 100 MG capsule Take 1 capsule (100 mg total) by mouth 2 (two) times daily. 20 capsule Bing Neighbors, FNP   fluconazole (DIFLUCAN) 150 MG tablet Take 1 tablet (150 mg total) by mouth daily. 2 tablet Bing Neighbors, FNP      PDMP not reviewed this encounter.   Bing Neighbors, FNP 05/02/21 1316

## 2021-05-04 LAB — URINE CULTURE
MICRO NUMBER:: 13241030
SPECIMEN QUALITY:: ADEQUATE

## 2021-05-24 IMAGING — DX DG FOOT COMPLETE 3+V*R*
3 series · 3 of 3 positions shown · non-contrast
Comparison: Radiographs 09/24/2019.

CLINICAL DATA: Right foot pain and swelling along the plantar
aspect of the foot for 2 days.

EXAM:
RIGHT FOOT COMPLETE - 3+ VIEW

[foot ap]
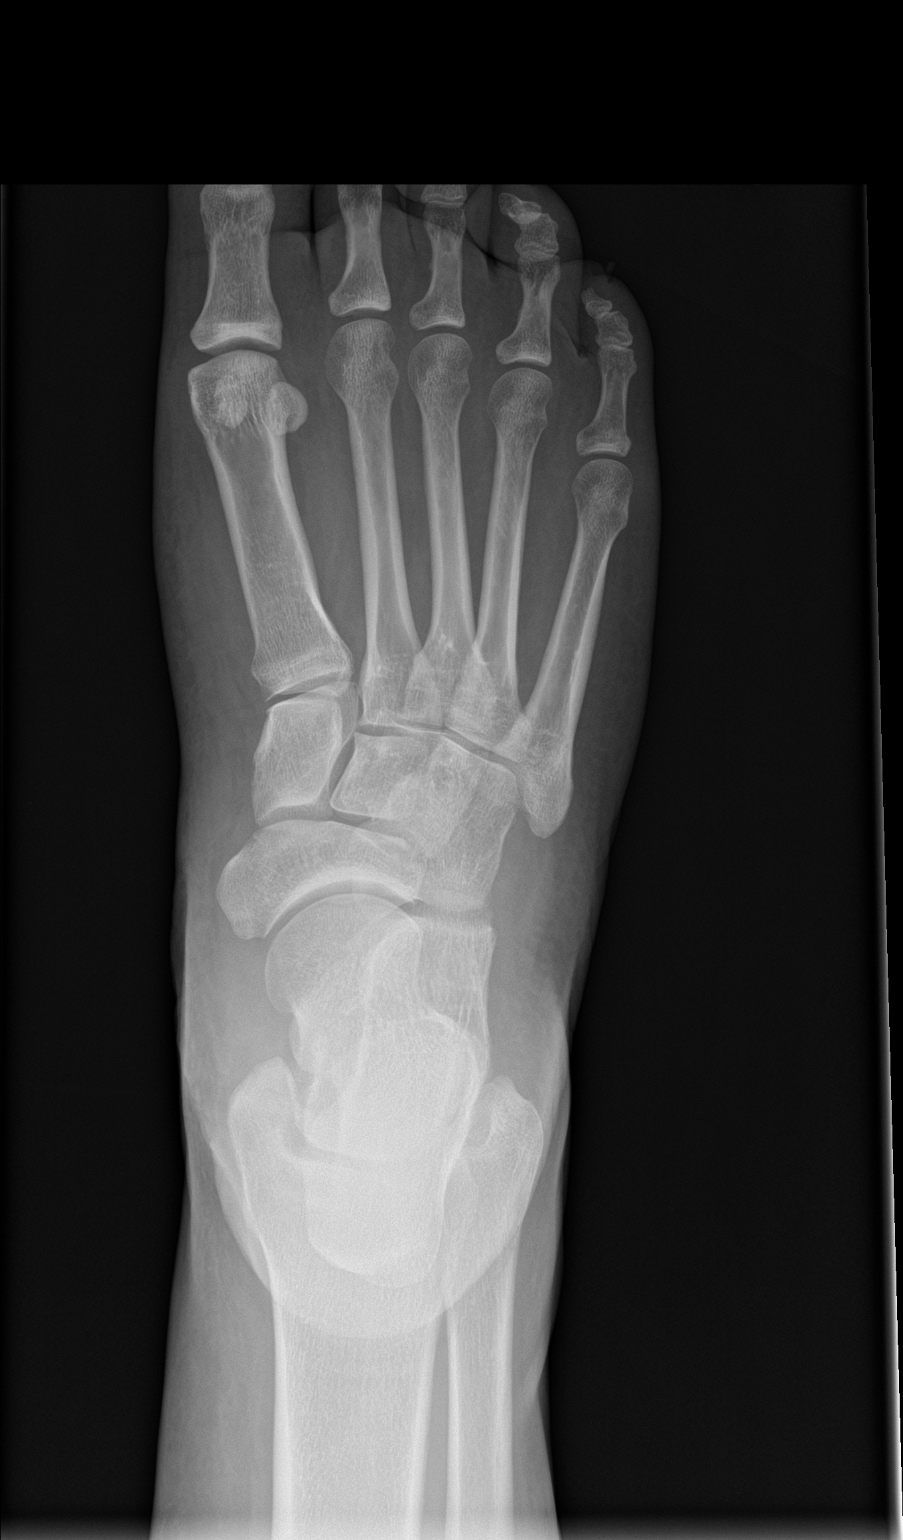

[foot obl]
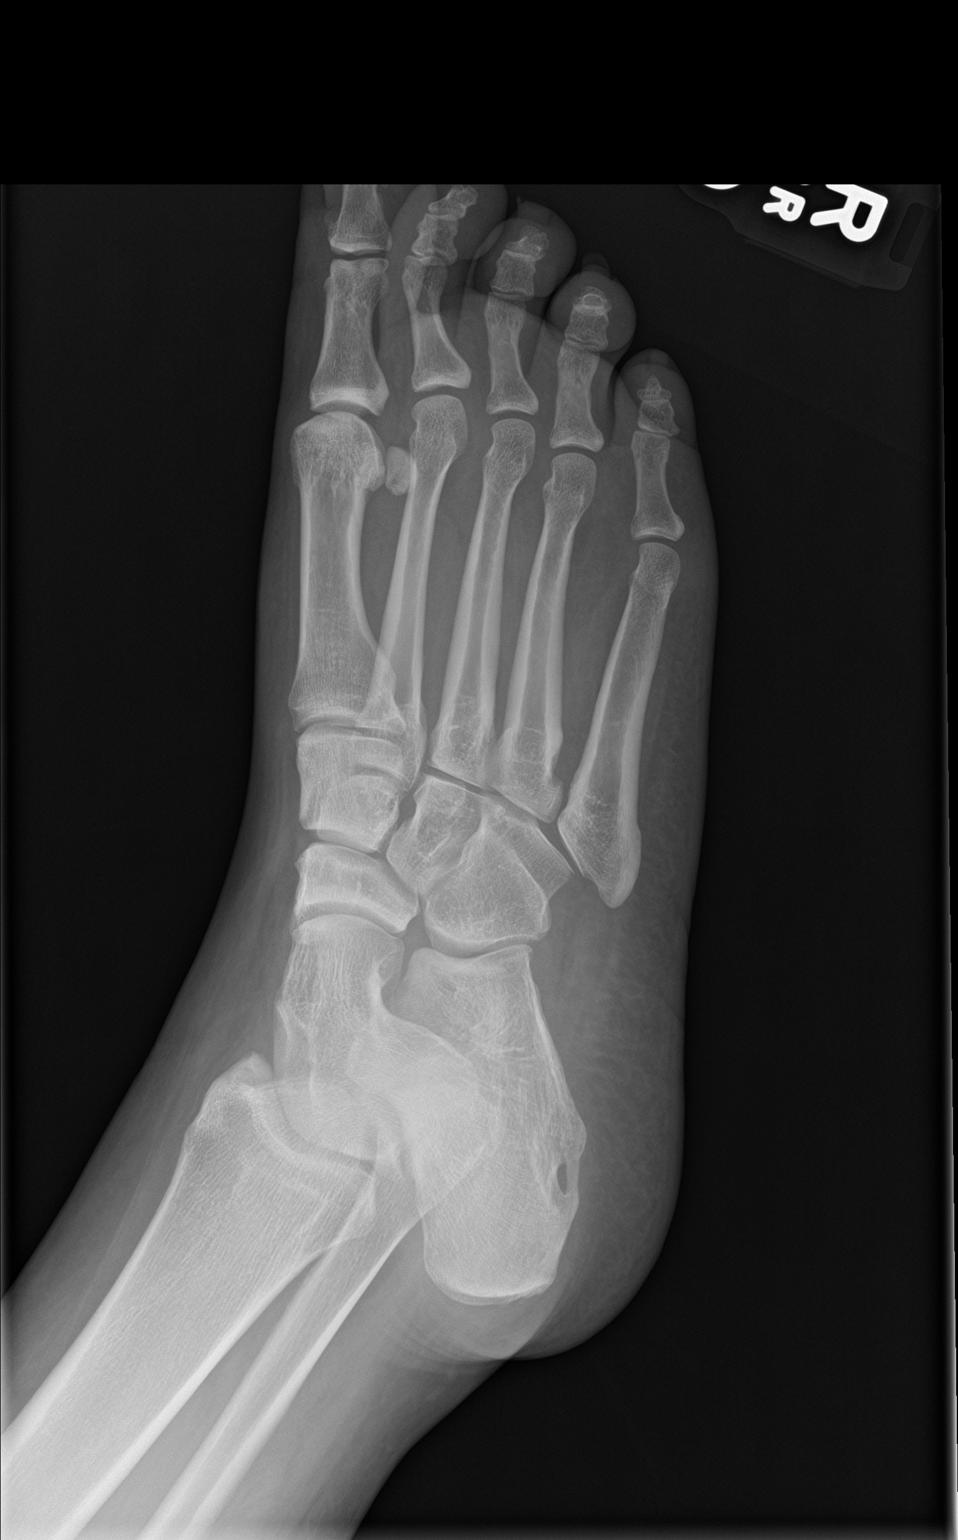

[foot lat]
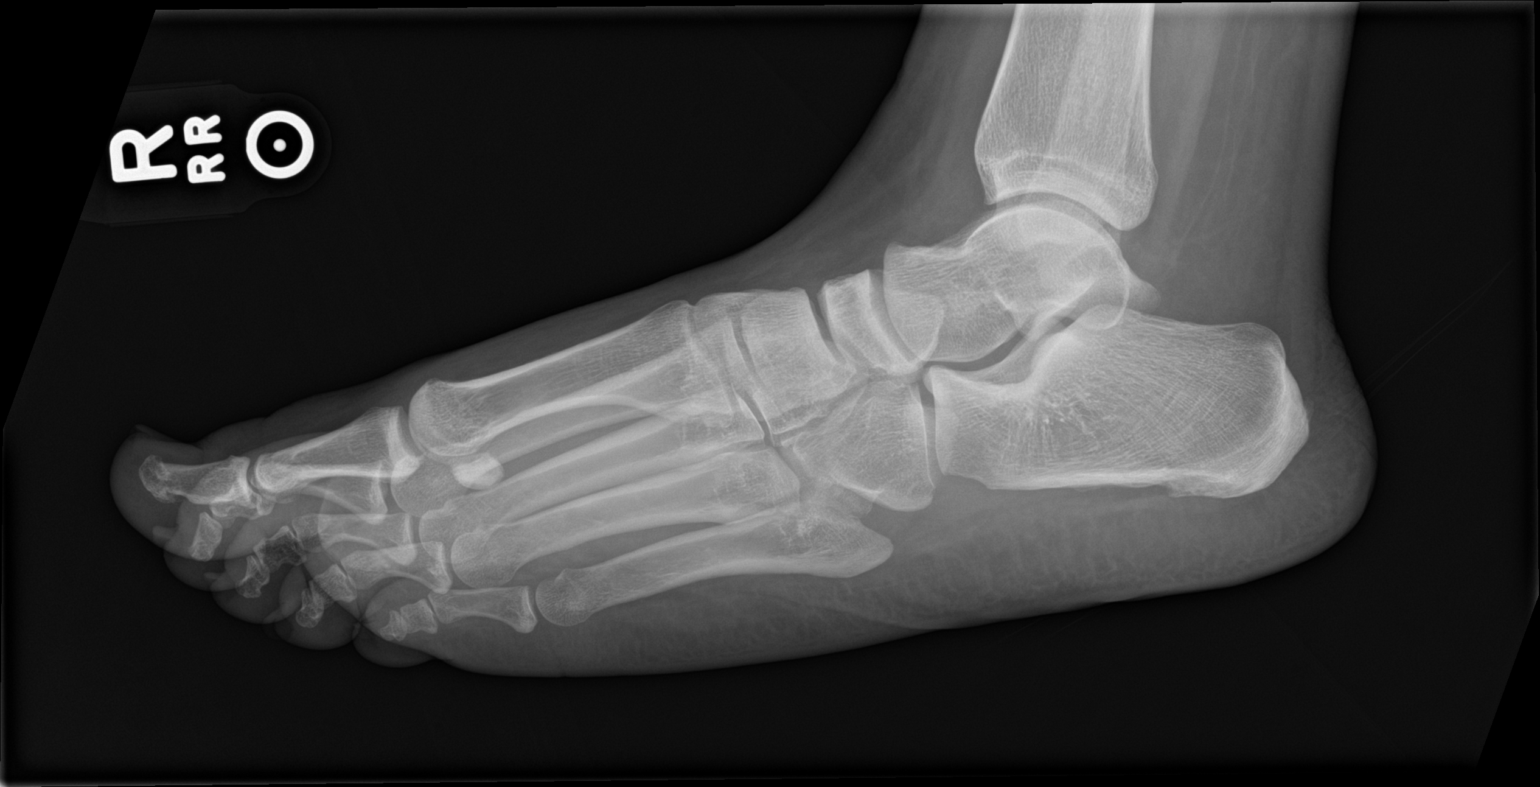

[3 of 3 positions shown; findings below may reference images not displayed]

FINDINGS: The mineralization and alignment are normal. There is no evidence of
acute fracture or dislocation. The joint spaces are preserved. No
focal soft tissue swelling or foreign body identified.
IMPRESSION: Stable examination.  No radiographic abnormalities identified.

## 2021-07-30 ENCOUNTER — Emergency Department
Admission: EM | Admit: 2021-07-30 | Discharge: 2021-07-30 | Disposition: A | Discharge status: Home / Self Care | Payer: Managed Care, Other (non HMO) | Source: Home / Self Care

## 2021-07-30 DIAGNOSIS — J209 Acute bronchitis, unspecified: Secondary | ICD-10-CM | POA: Diagnosis not present

## 2021-07-30 DIAGNOSIS — R300 Dysuria: Secondary | ICD-10-CM

## 2021-07-30 LAB — POCT URINALYSIS DIP (MANUAL ENTRY)
Glucose, UA: NEGATIVE mg/dL
Ketones, POC UA: NEGATIVE mg/dL
Leukocytes, UA: NEGATIVE
Nitrite, UA: NEGATIVE
Spec Grav, UA: >=1.03 — AB (ref 1.010–1.025)
Urobilinogen, UA: 0.2 E.U./dL
pH, UA: 6 (ref 5.0–8.0)

## 2021-07-30 NOTE — ED Triage Notes (Signed)
Pt c/o cough x 1-2 weeks. Also c/o intermittent tightness in chest since last night. No OTC meds tried. Would also like urine checked due to dysuria x 2 weeks.

## 2021-07-30 NOTE — Discharge Instructions (Signed)
Take medication as directed with food to completion. Also start Mucinex, 1200mg  twice a day. Increase fluid intake. Delsym at night for cough.  Start diflucan if start to get yeast symptoms.  Follow up if no improvement in one week.

## 2021-07-31 LAB — URINE CULTURE
MICRO NUMBER:: 13617831
Result:: NO GROWTH
SPECIMEN QUALITY:: ADEQUATE

## 2021-08-01 NOTE — ED Provider Notes (Signed)
[facility]    CSN: [id number] Arrival date & time: 07/30/21  0853      History   Chief Complaint Chief Complaint  Patient presents with   Cough    HPI MERARY GARGUILO is a 41 y.o. female.   Patient developed URI symptoms two weeks ago with mild sore throat, sinus congestion, and non-productive cough.  Her cough has persisted and last night she developed intermittent tightness in her anterior chest.  She denies pleuritic pain or shortness of breath.  She has a history of mild asthma. She also complains of two week history of urinary frequency and dysuria, but denies pelvic/abdominal pain or flank pain.  The history is provided by the patient.    Past Medical History:  Diagnosis Date   ADD (attention deficit disorder)    Anxiety    Chronic pain    Depression     Patient Active Problem List   Diagnosis Date Noted   Overactive bladder 07/14/2020   Spinal stenosis 07/14/2020   Current severe episode of major depressive disorder without psychotic features (HCC) 11/14/2019   Pre-diabetes 04/04/2017   Chronic midline low back pain with bilateral sciatica 09/01/2016   Recurrent UTI 01/02/2015   Allergic rhinoconjunctivitis of both eyes 01/01/2015   Mild persistent asthma, uncomplicated 01/01/2015   Chondromalacia of both patellae 09/04/2014   Chronic pain of both knees 08/18/2014   Pain of both sacroiliac joints 08/18/2014   Surveillance of (intrauterine) contraceptive device 06/06/2014   Seasonal allergies 06/07/2013   Chronic pain syndrome 04/01/2013   Arthropathy of lumbar facet joint 04/01/2013   Recurrent major depressive disorder, in partial remission (HCC) 12/06/2012   History of bariatric surgery 12/06/2012   OSA (obstructive sleep apnea) 10/04/2012   Seborrheic dermatitis of scalp 09/18/2012   Intractable chronic migraine without aura 04/19/2012   Anxiety 05/02/2011   Depression 10/22/2010   MIGRAINE HEADACHE 11/18/2008   BENIGN POSITIONAL VERTIGO  11/18/2008   POLYCYSTIC OVARIES 10/28/2008   ALLERGIC RHINITIS CAUSE UNSPECIFIED 06/02/2008   URINARY TRACT INFECTION, RECURRENT 03/25/2008   OBESITY, UNSPECIFIED 12/14/2006   ESSENTIAL HYPERTENSION, BENIGN 12/14/2006   DISC DISEASE, LUMBAR 12/14/2006    Past Surgical History:  Procedure Laterality Date   HERNIA REPAIR     LAPAROSCOPIC GASTRIC SLEEVE RESECTION     OVARIAN CYST REMOVAL Right     OB History   No obstetric history on file.      Home Medications    Prior to Admission medications   Medication Sig Start Date End Date Taking? Authorizing Provider  irbesartan (AVAPRO) 150 MG tablet Take by mouth. 06/29/21  Yes [provider]  acetaminophen (TYLENOL) 500 MG tablet Take by mouth. 12/06/18   [provider]  albuterol (VENTOLIN HFA) 108 (90 Base) MCG/ACT inhaler Inhale into the lungs. 07/21/13   [provider]  AUVELITY 45-105 MG TBCR Take by mouth. 03/17/21   [provider]  azelastine (ASTELIN) 0.1 % nasal spray SMARTSIG:2 Spray(s) Both Nares Twice Daily PRN 06/15/19   [provider]  azelastine (ASTELIN) 0.1 % nasal spray 2 sprays 2 (two) times daily. Patient not taking: Reported on 05/02/2021 07/13/19   [provider]  azelastine (ASTELIN) 0.1 % nasal spray Place into the nose. 07/22/20 07/22/21  [provider]  buprenorphine (BUTRANS) 7.5 MCG/HR 1 patch once a week. 06/02/20   [provider]  buPROPion (WELLBUTRIN XL) 300 MG 24 hr tablet Take 300 mg by mouth daily. Patient not taking: Reported on 05/02/2021  06/16/19   [provider]  buPROPion (WELLBUTRIN) 75 MG tablet Take 75 mg by mouth every morning. Patient not taking: Reported on 05/02/2021 10/19/20   [provider]  Cholecalciferol (VITAMIN D-3) 25 MCG (1000 UT) CAPS 1 capsule    [provider]  doxepin (SINEQUAN) 10 MG capsule Take by mouth. Patient not taking: Reported on 05/02/2021 11/21/20   [provider]   EPINEPHrine 0.3 mg/0.3 mL IJ SOAJ injection SMARTSIG:0.3 Milliliter(s) IM Daily PRN 05/27/20   [provider]  fluconazole (DIFLUCAN) 150 MG tablet Take 1 tablet (150 mg total) by mouth daily. 05/02/21   Bing Neighbors, FNP  Fluocinolone Acetonide Body 0.01 % OIL Apply topically. 04/21/21   [provider]  fluticasone Aleda Grana) 50 MCG/ACT nasal spray  04/18/19   [provider]  gentamicin cream (GARAMYCIN) 0.1 % Apply 1 application topically 2 (two) times daily. Patient not taking: Reported on 05/02/2021 06/15/20   Felecia Shelling, DPM  HYDROcodone-acetaminophen (NORCO) 7.5-325 MG tablet Take 1 tablet by mouth 3 (three) times daily as needed. 04/22/21   [provider]  ipratropium (ATROVENT) 0.03 % nasal spray  04/18/19   [provider]  LATUDA 40 MG TABS tablet Take 40 mg by mouth at bedtime. Patient not taking: Reported on 05/02/2021 11/14/20   [provider]  meloxicam (MOBIC) 15 MG tablet TAKE 1 TABLET (15 MG TOTAL) BY MOUTH DAILY. Patient not taking: Reported on 05/02/2021 09/21/20   Felecia Shelling, DPM  naloxone [facility]) nasal spray 4 mg/0.1 mL Place into the nose. 08/13/19   [provider]  naltrexone (DEPADE) 50 MG tablet Take 25 mg by mouth daily. Patient not taking: Reported on 05/02/2021 02/26/20   [provider]  nitrofurantoin, macrocrystal-monohydrate, (MACROBID) 100 MG capsule Take 1 capsule (100 mg total) by mouth 2 (two) times daily. 05/02/21   Bing Neighbors, FNP  NON FORMULARY KETAMINE treatment for depression--unsure of dose    [provider]  omeprazole (PRILOSEC) 40 MG capsule Take 40 mg by mouth 2 (two) times daily. 10/15/20   [provider]  orphenadrine (NORFLEX) 100 MG tablet Take 100 mg by mouth 2 (two) times daily as needed. 04/22/21   [provider]  Oxcarbazepine (TRILEPTAL) 300 MG tablet Take 600 mg by mouth daily.    [provider]  propranolol ER (INDERAL LA) 80  MG 24 hr capsule Take 160 mg by mouth. 04/05/21   [provider]  Saccharomyces boulardii (PROBIOTIC) 250 MG CAPS See admin instructions.    [provider]  solifenacin (VESICARE) 5 MG tablet Take 1 tablet by mouth daily. 09/29/20   [provider]  traZODone (DESYREL) 50 MG tablet Take by mouth at bedtime as needed. 11/13/20   [provider]    Family History Family History  Problem Relation Age of Onset   Hypertension Mother    Cancer Father    Heart failure Father    Diabetes Father    Hypertension Father     Social History Social History   Tobacco Use   Smoking status: Never   Smokeless tobacco: Never  Vaping Use   Vaping Use: Never used  Substance Use Topics   Alcohol use: Yes    Comment: rarely   Drug use: Not Currently     Allergies   Dust mite extract, Molds & smuts, Other, Quercus robur, Sulfa antibiotics, Amoxicillin-pot clavulanate, Bactrim [sulfamethoxazole-trimethoprim], Tramadol hcl, Bee pollen, and Pollen extract   Review of Systems  Review of Systems + sore throat + cough No pleuritic pain No wheezing + nasal congestion + post-nasal drainage No sinus pain/pressure No itchy/red eyes No earache No hemoptysis No SOB No fever/chills No nausea No vomiting No abdominal pain No diarrhea + frequency/dysuria No skin rash No fatigue No myalgias No headache   Physical Exam Triage Vital Signs ED Triage Vitals  Enc Vitals Group     BP 07/30/21 0900 (!) 157/98     Pulse Rate 07/30/21 0900 74     Resp 07/30/21 0900 17     Temp 07/30/21 0900 98.6 F (37 C)     Temp Source 07/30/21 0900 Oral     SpO2 07/30/21 0900 99 %     Weight --      Height --      Head Circumference --      Peak Flow --      Pain Score 07/30/21 0903 0     Pain Loc --      Pain Edu? --      Excl. in GC? --    No data found.  Updated Vital Signs BP (!) 157/98 (BP Location: Left Arm)   Pulse 74   Temp 98.6 F (37 C) (Oral)    Resp 17   LMP  (LMP Unknown)   SpO2 99%   Visual Acuity Right Eye Distance:   Left Eye Distance:   Bilateral Distance:    Right Eye Near:   Left Eye Near:    Bilateral Near:     Physical Exam Nursing notes and Vital Signs reviewed. Appearance:  Patient appears stated age, and in no acute distress Eyes:  Pupils are equal, round, and reactive to light and accomodation.  Extraocular movement is intact.  Conjunctivae are not inflamed  Ears:  Canals normal.  Tympanic membranes normal.  Nose:  Mildly congested turbinates.  No sinus tenderness.   Pharynx:  Normal Neck:  Supple.  Mildly enlarged lateral nodes are present, tender to palpation on the left.   Lungs:  Clear to auscultation.  Breath sounds are equal.  Moving air well. Heart:  Regular rate and rhythm without murmurs, rubs, or gallops.  Abdomen:  Nontender without masses or hepatosplenomegaly.  Bowel sounds are present.  No CVA or flank tenderness.  Extremities:  No edema.  Skin:  No rash present.   UC Treatments / Results  Labs (all labs ordered are listed, but only abnormal results are displayed) Labs Reviewed  POCT URINALYSIS DIP (MANUAL ENTRY) - Abnormal; Notable for the following components:      Result Value   Clarity, UA cloudy (*)    Bilirubin, UA small (*)    Spec Grav, UA >=1.030 (*)    Blood, UA large (*)    Protein Ur, POC trace (*)    All other components within normal limits  URINE CULTURE    EKG   Radiology No results found.  Procedures Procedures (including critical care time)  Medications Ordered in UC Medications - No data to display  Initial Impression / Assessment and Plan / UC Course  I have reviewed the triage vital signs and the nursing notes.  Pertinent labs & imaging results that were available during my care of the patient were reviewed by me and considered in my medical decision making (see chart for details).    Urine culture pending.  Rx written for empiric Keflex 500mg  TID,  #15, no refill.  Rx prednisone 20mg  burst/taper, #11, no refill. Rx for  Diflucan 150mg  at patient's request, #2, no refill. Followup with Family Doctor if not improved in one week.   Final Clinical Impressions(s) / UC Diagnoses   Final diagnoses:  Dysuria  Acute bronchitis, unspecified organism     Discharge Instructions      Take medication as directed with food to completion. Also start Mucinex, 1200mg  twice a day. Increase fluid intake. Delsym at night for cough.  Start diflucan if start to get yeast symptoms.  Follow up if no improvement in one week.   ED Prescriptions   None       , MD 08/01/21 2004

## 2022-02-10 ENCOUNTER — Ambulatory Visit
Admission: RE | Admit: 2022-02-10 | Discharge: 2022-02-10 | Disposition: A | Discharge status: Home / Self Care | Payer: Managed Care, Other (non HMO) | Source: Ambulatory Visit

## 2022-02-10 VITALS — BP 138/96 | HR 85 | Temp 98.4°F | Resp 16 | Ht 71.5 in | Wt 283.0 lb

## 2022-02-10 DIAGNOSIS — U071 COVID-19: Secondary | ICD-10-CM

## 2022-02-10 DIAGNOSIS — J4541 Moderate persistent asthma with (acute) exacerbation: Secondary | ICD-10-CM

## 2022-02-10 HISTORY — DX: Hematuria, unspecified: R31.9

## 2022-02-10 MED ORDER — CETIRIZINE HCL 10 MG PO TABS: 10.0000 mg | ORAL_TABLET | Freq: Every day | ORAL | 1 refills | Status: AC

## 2022-02-10 MED ORDER — AEROCHAMBER PLUS FLO-VU MEDIUM MISC: 1.0000 | Freq: Once | Status: DC

## 2022-02-10 MED ORDER — AZITHROMYCIN 250 MG PO TABS: ORAL_TABLET | ORAL | 0 refills | Status: AC

## 2022-02-10 MED ORDER — IPRATROPIUM BROMIDE 0.06 % NA SOLN: 2.0000 | Freq: Three times a day (TID) | NASAL | 1 refills | Status: AC

## 2022-02-10 MED ORDER — DEXAMETHASONE 6 MG PO TABS
6.0000 mg | ORAL_TABLET | Freq: Two times a day (BID) | ORAL | 0 refills | Status: AC
Start: 2022-02-10 — End: 2022-02-15

## 2022-02-10 MED ORDER — ALBUTEROL SULFATE HFA 108 (90 BASE) MCG/ACT IN AERS: 2.0000 | INHALATION_SPRAY | Freq: Four times a day (QID) | RESPIRATORY_TRACT | 5 refills | Status: AC | PRN

## 2022-02-10 MED ORDER — ALBUTEROL SULFATE HFA 108 (90 BASE) MCG/ACT IN AERS
2.0000 | INHALATION_SPRAY | Freq: Once | RESPIRATORY_TRACT | Status: AC
  Administered 2022-02-10: 2 via RESPIRATORY_TRACT

## 2022-02-10 MED ORDER — PROMETHAZINE-DM 6.25-15 MG/5ML PO SYRP
5.0000 mL | ORAL_SOLUTION | Freq: Every evening | ORAL | 0 refills | Status: AC | PRN
Start: 2022-02-10 — End: ?

## 2022-02-10 MED ORDER — GUAIFENESIN 400 MG PO TABS
ORAL_TABLET | ORAL | 0 refills | Status: AC
Start: 2022-02-10 — End: ?

## 2022-02-10 MED ORDER — ALBUTEROL SULFATE HFA 108 (90 BASE) MCG/ACT IN AERS: 2.0000 | INHALATION_SPRAY | Freq: Once | RESPIRATORY_TRACT | Status: DC

## 2022-02-10 MED ORDER — TRELEGY ELLIPTA 100-62.5-25 MCG/ACT IN AEPB: 1.0000 | INHALATION_SPRAY | Freq: Every day | RESPIRATORY_TRACT | 2 refills | Status: AC

## 2022-02-10 NOTE — ED Triage Notes (Signed)
Pt presents to Urgent Care with c/o sore throat x 5 days and cough and body aches x 3 days. Tested positive for COVID 2 days ago. Reports she is feeling worse.

## 2022-02-10 NOTE — ED Provider Notes (Signed)
[facility]    CSN: [id number] Arrival date & time: 02/10/22  0935    HISTORY   Chief Complaint  Patient presents with   Covid Positive   HPI Caroline Nelson is a pleasant, 42 y.o. female who presents to urgent care today. Patient reports to being positive for COVID-19 2 days ago after a 3-day history of deep, burning cough, body aches, fatigue, headache.  Patient reports a history of asthma and allergies, states not currently taking any allergy medications, states her albuterol inhaler expired in 2020, states she has not tried using the albuterol inhaler since her symptoms began.  Patient states prior to this illness, she was using her albuterol inhaler once or twice a month, states sometimes it helps sometimes it does not, states her main symptom is usually a sensation of feeling tight in her chest.  Patient denies shortness of breath at this time.  Patient has excellent oxygen saturation on arrival today and while her blood pressure is elevated, vital signs are otherwise normal.  Patient states she needs a note for work.  Patient states she is also wanting some relief of the burning sensation in her chest when she coughs.  The history is provided by the patient.   Past Medical History:  Diagnosis Date   ADD (attention deficit disorder)    Anxiety    Chronic pain    Depression    Hematuria    Patient Active Problem List   Diagnosis Date Noted   Overactive bladder 07/14/2020   Spinal stenosis 07/14/2020   Current severe episode of major depressive disorder without psychotic features (HCC) 11/14/2019   Pre-diabetes 04/04/2017   Chronic midline low back pain with bilateral sciatica 09/01/2016   Recurrent UTI 01/02/2015   Allergic rhinoconjunctivitis of both eyes 01/01/2015   Mild persistent asthma, uncomplicated 01/01/2015   Chondromalacia of both patellae 09/04/2014   Chronic pain of both knees 08/18/2014   Pain of both sacroiliac joints 08/18/2014   Surveillance  of (intrauterine) contraceptive device 06/06/2014   Seasonal allergies 06/07/2013   Chronic pain syndrome 04/01/2013   Arthropathy of lumbar facet joint 04/01/2013   Recurrent major depressive disorder, in partial remission (HCC) 12/06/2012   History of bariatric surgery 12/06/2012   OSA (obstructive sleep apnea) 10/04/2012   Seborrheic dermatitis of scalp 09/18/2012   Intractable chronic migraine without aura 04/19/2012   Anxiety 05/02/2011   Depression 10/22/2010   MIGRAINE HEADACHE 11/18/2008   BENIGN POSITIONAL VERTIGO 11/18/2008   POLYCYSTIC OVARIES 10/28/2008   ALLERGIC RHINITIS CAUSE UNSPECIFIED 06/02/2008   URINARY TRACT INFECTION, RECURRENT 03/25/2008   OBESITY, UNSPECIFIED 12/14/2006   ESSENTIAL HYPERTENSION, BENIGN 12/14/2006   DISC DISEASE, LUMBAR 12/14/2006   Past Surgical History:  Procedure Laterality Date   HERNIA REPAIR     LAPAROSCOPIC GASTRIC SLEEVE RESECTION     OVARIAN CYST REMOVAL Right    OB History   No obstetric history on file.    Home Medications    Prior to Admission medications   Medication Sig Start Date End Date Taking? Authorizing Provider  albuterol (VENTOLIN HFA) 108 (90 Base) MCG/ACT inhaler Inhale 2 puffs into the lungs every 6 (six) hours as needed for wheezing or shortness of breath (Cough). 02/10/22  Yes Theadora Rama Scales, Caroline Nelson  azithromycin (ZITHROMAX) 250 MG tablet Take 2 tablets (500 mg total) by mouth daily for 1 day, THEN 1 tablet (250 mg total) daily for 4 days. 02/10/22 02/14/22 Yes Theadora Rama Scales, Caroline Nelson  cetirizine (ZYRTEC ALLERGY) 10  MG tablet Take 1 tablet (10 mg total) by mouth at bedtime. 02/10/22 08/09/22 Yes Theadora RamaMorgan, Nubia Ziesmer Scales, Caroline Nelson  dexamethasone (DECADRON) 6 MG tablet Take 1 tablet (6 mg total) by mouth 2 (two) times daily with a meal for 5 days. 02/10/22 02/15/22 Yes Theadora RamaMorgan, Stephens Shreve Scales, Caroline Nelson  Fluticasone-Umeclidin-Vilant (TRELEGY ELLIPTA) 100-62.5-25 MCG/ACT AEPB Inhale 1 puff into the lungs daily. 02/10/22  Yes  Theadora RamaMorgan, Kenadee Gates Scales, Caroline Nelson  guaifenesin (HUMIBID E) 400 MG TABS tablet Take 1 tablet 3 times daily as needed for chest congestion and cough 02/10/22  Yes Theadora RamaMorgan, Ariba Lehnen Scales, Caroline Nelson  ipratropium (ATROVENT) 0.06 % nasal spray Place 2 sprays into both nostrils 3 (three) times daily. As needed for nasal congestion, runny nose 02/10/22  Yes Theadora RamaMorgan, Tomia Enlow Scales, Caroline Nelson  NIFEdipine (PROCARDIA XL/NIFEDICAL XL) 60 MG 24 hr tablet Take by mouth. 02/01/22  Yes [provider]  promethazine-dextromethorphan (PROMETHAZINE-DM) 6.25-15 MG/5ML syrup Take 5 mLs by mouth at bedtime as needed for cough. 02/10/22  Yes Theadora RamaMorgan, Verble Styron Scales, Caroline Nelson  rizatriptan (MAXALT) 10 MG tablet Take by mouth. 11/16/21  Yes [provider]  AUVELITY 45-105 MG TBCR Take by mouth. 03/17/21   [provider]  Cholecalciferol (VITAMIN D-3) 25 MCG (1000 UT) CAPS 1 capsule    [provider]  EPINEPHrine 0.3 mg/0.3 mL IJ SOAJ injection SMARTSIG:0.3 Milliliter(s) IM Daily PRN 05/27/20   [provider]  Fluocinolone Acetonide Body 0.01 % OIL Apply topically. 04/21/21   [provider]  fluticasone Aleda Grana(FLONASE) 50 MCG/ACT nasal spray  04/18/19   [provider]  HYDROcodone-acetaminophen (NORCO) 7.5-325 MG tablet Take 1 tablet by mouth 3 (three) times daily as needed. 04/22/21   [provider]  irbesartan (AVAPRO) 150 MG tablet Take by mouth. 06/29/21   [provider]  naloxone [facility](NARCAN) nasal spray 4 mg/0.1 mL Place into the nose. 08/13/19   [provider]  omeprazole (PRILOSEC) 40 MG capsule Take 40 mg by mouth 2 (two) times daily. 10/15/20   [provider]  orphenadrine (NORFLEX) 100 MG tablet Take 100 mg by mouth 2 (two) times daily as needed. 04/22/21   [provider]  Oxcarbazepine (TRILEPTAL) 300 MG tablet Take 600 mg by mouth daily.    [provider]  propranolol ER (INDERAL LA) 80 MG 24 hr capsule Take 160 mg by mouth. 04/05/21    [provider]  Saccharomyces boulardii (PROBIOTIC) 250 MG CAPS See admin instructions.    [provider]  solifenacin (VESICARE) 5 MG tablet Take 1 tablet by mouth daily. 09/29/20   [provider]  traZODone (DESYREL) 50 MG tablet Take by mouth at bedtime as needed. 11/13/20   [provider]    Family History Family History  Problem Relation Age of Onset   Hypertension Mother    Cancer Father    Heart failure Father    Diabetes Father    Hypertension Father    Social History Social History   Tobacco Use   Smoking status: Never   Smokeless tobacco: Never  Vaping Use   Vaping Use: Never used  Substance Use Topics   Alcohol use: Yes    Comment: rarely   Drug use: Not Currently   Allergies   Dust mite extract, Molds & smuts, Other, Quercus robur, Sulfa antibiotics, Amoxicillin-pot clavulanate, Bactrim [sulfamethoxazole-trimethoprim], Tramadol hcl, Bee pollen, and Pollen extract  Review of Systems Review of Systems Pertinent findings revealed after performing a 14 point review of systems has been noted in the history  of present illness.  Physical Exam Triage Vital Signs ED Triage Vitals  Enc Vitals Group     BP 11/20/20 0827 (!) 147/82     Pulse Rate 11/20/20 0827 72     Resp 11/20/20 0827 18     Temp 11/20/20 0827 98.3 F (36.8 C)     Temp Source 11/20/20 0827 Oral     SpO2 11/20/20 0827 98 %     Weight --      Height --      Head Circumference --      Peak Flow --      Pain Score 11/20/20 0826 5     Pain Loc --      Pain Edu? --      Excl. in GC? --   No data found.  Updated Vital Signs BP (!) 138/96 (BP Location: Left Arm)   Pulse 85   Temp 98.4 F (36.9 C) (Oral)   Resp 16   Ht 5' 11.5" (1.816 m)   Wt 283 lb (128.4 kg)   LMP  (LMP Unknown) Comment: Not sexually active  SpO2 99%   BMI 38.92 kg/m   Physical Exam Vitals and nursing note reviewed.  Constitutional:      General: She is not in acute distress.     Appearance: Normal appearance. She is not ill-appearing.  HENT:     Head: Normocephalic and atraumatic.     Salivary Glands: Right salivary gland is not diffusely enlarged or tender. Left salivary gland is not diffusely enlarged or tender.     Right Ear: Ear canal and external ear normal. No drainage. A middle ear effusion is present. There is no impacted cerumen. Tympanic membrane is bulging. Tympanic membrane is not injected or erythematous.     Left Ear: Ear canal and external ear normal. No drainage. A middle ear effusion is present. There is no impacted cerumen. Tympanic membrane is bulging. Tympanic membrane is not injected or erythematous.     Ears:     Comments: Bilateral EACs normal, both TMs bulging with clear fluid    Nose: Rhinorrhea present. No nasal deformity, septal deviation, signs of injury, nasal tenderness, mucosal edema or congestion. Rhinorrhea is clear.     Right Nostril: Occlusion present. No foreign body, epistaxis or septal hematoma.     Left Nostril: Occlusion present. No foreign body, epistaxis or septal hematoma.     Right Turbinates: Enlarged, swollen and pale.     Left Turbinates: Enlarged, swollen and pale.     Right Sinus: No maxillary sinus tenderness or frontal sinus tenderness.     Left Sinus: No maxillary sinus tenderness or frontal sinus tenderness.     Mouth/Throat:     Lips: Pink. No lesions.     Mouth: Mucous membranes are moist. No oral lesions.     Pharynx: Oropharynx is clear. Uvula midline. No posterior oropharyngeal erythema or uvula swelling.     Tonsils: No tonsillar exudate. 0 on the right. 0 on the left.     Comments: Postnasal drip Eyes:     General: Lids are normal.        Right eye: No discharge.        Left eye: No discharge.     Extraocular Movements: Extraocular movements intact.     Conjunctiva/sclera: Conjunctivae normal.     Right eye: Right conjunctiva is not injected.     Left eye: Left conjunctiva is not injected.  Neck:      Trachea:  Trachea and phonation normal.  Cardiovascular:     Rate and Rhythm: Normal rate and regular rhythm.     Pulses: Normal pulses.     Heart sounds: Normal heart sounds. No murmur heard.    No friction rub. No gallop.  Pulmonary:     Effort: Pulmonary effort is normal. No tachypnea, bradypnea, accessory muscle usage, prolonged expiration, respiratory distress or retractions.     Breath sounds: No stridor, decreased air movement or transmitted upper airway sounds. Examination of the right-upper field reveals decreased breath sounds. Examination of the left-upper field reveals decreased breath sounds. Examination of the right-middle field reveals decreased breath sounds. Examination of the left-middle field reveals decreased breath sounds. Examination of the right-lower field reveals decreased breath sounds. Examination of the left-lower field reveals decreased breath sounds. Decreased breath sounds present. No wheezing, rhonchi or rales.     Comments: Repeat auscultation post bronchodilator treatment during visit today did not reveal any improvement of breath sounds. Chest:     Chest wall: No tenderness.  Musculoskeletal:        General: Normal range of motion.     Cervical back: Normal range of motion and neck supple. Normal range of motion.  Lymphadenopathy:     Cervical: No cervical adenopathy.  Skin:    General: Skin is warm and dry.     Findings: No erythema or rash.  Neurological:     General: No focal deficit present.     Mental Status: She is alert and oriented to person, place, and time.  Psychiatric:        Mood and Affect: Mood normal.        Behavior: Behavior normal.     Visual Acuity Right Eye Distance:   Left Eye Distance:   Bilateral Distance:    Right Eye Near:   Left Eye Near:    Bilateral Near:     UC Couse / Diagnostics / Procedures:     Radiology No results found.  Procedures Procedures (including critical care time) EKG  Pending results:   Labs Reviewed - No data to display  Medications Ordered in UC: Medications  AeroChamber Plus Flo-Vu Medium MISC 1 each (has no administration in time range)  albuterol (VENTOLIN HFA) 108 (90 Base) MCG/ACT inhaler 2 puff (2 puffs Inhalation Given 02/10/22 1008)    UC Diagnoses / Final Clinical Impressions(s)   I have reviewed the triage vital signs and the nursing notes.  Pertinent labs & imaging results that were available during my care of the patient were reviewed by me and considered in my medical decision making (see chart for details).    Final diagnoses:  COVID-19  Moderate persistent asthma with acute exacerbation   Patient did not report any meaningful improvement after inhaled bronchodilator during her visit today.  Patient provided with renewal of the bronchodilator and also advised to begin Trelegy daily.  Because patient is positive for COVID-19 and it has been 5 days since her symptoms began, she does not qualify for Paxlovid but would benefit from a 5-day course of dexamethasone 6 mg twice daily.  Patient provided with cough medications and advised to continue Tylenol and ibuprofen as needed.  Because patient is nearing day 7 of a viral bronchitis, I have also provided her with a prescription for azithromycin that she should pick up and begin taking if she is not feeling better in the next 48 to 72 hours or if her symptoms begin to worsen.  Patient was provided with  instructions in detail and competently verbalized understanding.  Conservative care recommended, return precautions advised.  Patient advised to reach out to telemedicine if she requires an extension of her return to work note.  Please see discharge instructions below for further details of plan of care as provided to patient. ED Prescriptions     Medication Sig Dispense Auth. Provider   albuterol (VENTOLIN HFA) 108 (90 Base) MCG/ACT inhaler Inhale 2 puffs into the lungs every 6 (six) hours as needed for  wheezing or shortness of breath (Cough). 36 g Caroline Oxford Scales, Caroline Nelson   dexamethasone (DECADRON) 6 MG tablet Take 1 tablet (6 mg total) by mouth 2 (two) times daily with a meal for 5 days. 10 tablet Caroline Oxford Scales, Caroline Nelson   guaifenesin (HUMIBID E) 400 MG TABS tablet Take 1 tablet 3 times daily as needed for chest congestion and cough 21 tablet Caroline Oxford Scales, Caroline Nelson   promethazine-dextromethorphan (PROMETHAZINE-DM) 6.25-15 MG/5ML syrup Take 5 mLs by mouth at bedtime as needed for cough. 60 mL Caroline Oxford Scales, Caroline Nelson   azithromycin (ZITHROMAX) 250 MG tablet Take 2 tablets (500 mg total) by mouth daily for 1 day, THEN 1 tablet (250 mg total) daily for 4 days. 6 tablet Caroline Oxford Scales, Caroline Nelson   Fluticasone-Umeclidin-Vilant (TRELEGY ELLIPTA) 100-62.5-25 MCG/ACT AEPB Inhale 1 puff into the lungs daily. 30 each Caroline Oxford Scales, Caroline Nelson   ipratropium (ATROVENT) 0.06 % nasal spray Place 2 sprays into both nostrils 3 (three) times daily. As needed for nasal congestion, runny nose 15 mL Caroline Oxford Scales, Caroline Nelson   cetirizine (ZYRTEC ALLERGY) 10 MG tablet Take 1 tablet (10 mg total) by mouth at bedtime. 90 tablet Caroline Nelson, [place]      I have reviewed the PDMP during this encounter.  Disposition Upon Discharge:  Condition: stable for discharge home Home: take medications as prescribed; routine discharge instructions as discussed; follow up as advised.  Patient presented with an acute illness with associated systemic symptoms and significant discomfort requiring urgent management. In my opinion, this is a condition that a prudent lay person (someone who possesses an average knowledge of health and medicine) may potentially expect to result in complications if not addressed urgently such as respiratory distress, impairment of bodily function or dysfunction of bodily organs.   Routine symptom specific, illness specific and/or disease specific instructions were  discussed with the patient and/or caregiver at length.   As such, the patient has been evaluated and assessed, work-up was performed and treatment was provided in alignment with urgent care protocols and evidence based medicine.  Patient/parent/caregiver has been advised that the patient may require follow up for further testing and treatment if the symptoms continue in spite of treatment, as clinically indicated and appropriate.  If the patient was tested for COVID-19, Influenza and/or RSV, then the patient/parent/guardian was advised to isolate at home pending the results of his/her diagnostic coronavirus test and potentially longer if they're positive. I have also advised pt that if his/her COVID-19 test returns positive, it's recommended to self-isolate for at least 10 days after symptoms first appeared AND until fever-free for 24 hours without fever reducer AND other symptoms have improved or resolved. Discussed self-isolation recommendations as well as instructions for household member/close contacts as per the [facility] and [place] DH[facility], and also gave patient the [facility] packet with this information.  Patient/parent/caregiver has been advised to return to the [facility] or PCP in 3-5 days if no better; to PCP or the Emergency Department if new signs  and symptoms develop, or if the current signs or symptoms continue to change or worsen for further workup, evaluation and treatment as clinically indicated and appropriate  The patient will follow up with their current PCP if and as advised. If the patient does not currently have a PCP we will assist them in obtaining one.   The patient may need specialty follow up if the symptoms continue, in spite of conservative treatment and management, for further workup, evaluation, consultation and treatment as clinically indicated and appropriate.  Patient/parent/caregiver verbalized understanding and agreement of plan as discussed.  All questions were addressed during visit.   Please see discharge instructions below for further details of plan.  Discharge Instructions:   Discharge Instructions      Please read below to learn more about the medications, dosages and frequencies that I recommend to help alleviate your symptoms and to get you feeling better soon:   Decadron (dexamethasone):  To quickly address your significant respiratory inflammation causing bronchitis and pain in your chest when you cough, please begin taking Decadron twice daily for a full 5 days.  This steroid is superior to all other steroids available in treating the inflammation caused by COVID-19 infection.    Zyrtec (cetirizine): This is an excellent second-generation antihistamine that helps to reduce respiratory inflammatory response to environmental allergens.  In some patients, this medication can cause daytime sleepiness so I recommend that you take 1 tablet daily at bedtime.     Atrovent (ipratropium): This is an excellent nasal decongestant spray I have added to your recommended nasal steroid that will not cause rebound congestion, please instill 2 sprays into each nare with each use.  Because nasal steroids can take several days before they begin to provide full benefit, I recommend that you use this spray in addition to the nasal steroid prescribed for you.  Please use it after you have used your nasal steroid and repeat up to 4 times daily as needed.  I have provided you with a prescription for this medication.      ProAir, Ventolin, Proventil (albuterol): This inhaled medication contains a short acting beta agonist bronchodilator.  This medication works on the smooth muscle that opens and constricts of your airways by relaxing the muscle.  The result of relaxation of the smooth muscle is increased air movement and improved work of breathing.  This is a short acting medication that can be used every 4-6 hours as needed for increased work of breathing, shortness of breath, wheezing and  excessive coughing.  I have provided you with a prescription.    Trelegy (fluticasone, vilanterol and umeclidinium):  This inhaled medication contains a corticosteroid and long-acting form of albuterol.  The inhaled steroid and this medication  is not absorbed into the body and will not cause side effects such as increased blood sugar levels, irritability, sleeplessness or weight gain.  Inhaled corticosteroid are sort of like topical steroid creams but, as you can imagine, it is not practical to attempt to rub a steroid cream inside of your lungs.  The long-acting albuterol works similarly to the short acting albuterol found in your rescue inhaler but provides 24-hour relaxation of the smooth muscles that open and constrict your airways; your short acting rescue inhaler can only provide for a few hours this benefit for a few hours.  The third unique ingredient, umeclidinium, is an antimuscarinic and works similarly to your albuterol, providing long-acting relaxation of the smooth muscles in your airway.  Please feel free to  continue using your short acting rescue inhaler as often as needed throughout the day for shortness of breath, wheezing, and cough.  Advil, Motrin (ibuprofen): This is a good anti-inflammatory medication which addresses aches, pains and inflammation of the upper airways that causes sinus and nasal congestion as well as in the lower airways which makes your cough feel tight and sometimes burn.  I recommend that you take between 400 to 600 mg every 6-8 hours as needed.      Tylenol (acetaminophen): This is a good fever reducer.  If your body temperature rises above 101.5 as measured with a thermometer, it is recommended that you take 1,000 mg every 8 hours until your temperature falls below 101.5, please not take more than 3,000 mg of acetaminophen either as a separate medication or as in ingredient in an over-the-counter cold/flu preparation within a 24-hour period.      Robitussin,  Mucinex (guaifenesin): This is an expectorant.  This helps break up chest congestion and loosen up thick nasal drainage making phlegm and drainage more liquid and therefore easier to remove.  I recommend being 400 mg three times daily as needed.      Promethazine DM: Promethazine is both a nasal decongestant and an antinausea medication that makes most patients feel fairly sleepy.  The DM is dextromethorphan, a cough suppressant found in many over-the-counter cough medications.  Please take 5 mL before bedtime to minimize your cough which will help you sleep better.  I have sent a prescription for this medication to your pharmacy.   Z-Pak (azithromycin): After 7 days of viral infection in the lungs, it is is very common for bacteria infection to set in and cause pneumonia.  For this reason, I have prescribed this antibiotic for you.  In the next 2-3 days, if you find that you are not making significant progress with the above medications, please pick up and begin this medication by taking 2 tablets the first day, then take 1 tablet daily every day thereafter until complete.  Please follow-up within the next 5-7 days either with your primary care provider or urgent care if your symptoms do not resolve.  If you do not have a primary care provider, we will assist you in finding one.  I have provided you with a note to be out of work for the next 3 days.  If you find that you need to be out a little bit longer, please reach out to the COVID health website to schedule a telehealth visit where you can discuss extending your work note.        Thank you for visiting urgent care today.  We appreciate the opportunity to participate in your care.     This office note has been dictated using Teaching laboratory technicianDragon speech recognition software.  Unfortunately, this method of dictation can sometimes lead to typographical or grammatical errors.  I apologize for your inconvenience in advance if this occurs.  Please do not hesitate to  reach out to me if clarification is needed.      Theadora RamaMorgan, Leandro Berkowitz Scales, Caroline Nelson 02/10/22 1058

## 2022-02-10 NOTE — Discharge Instructions (Addendum)
Please read below to learn more about the medications, dosages and frequencies that I recommend to help alleviate your symptoms and to get you feeling better soon:   Decadron (dexamethasone):  To quickly address your significant respiratory inflammation causing bronchitis and pain in your chest when you cough, please begin taking Decadron twice daily for a full 5 days.  This steroid is superior to all other steroids available in treating the inflammation caused by COVID-19 infection.    Zyrtec (cetirizine): This is an excellent second-generation antihistamine that helps to reduce respiratory inflammatory response to environmental allergens.  In some patients, this medication can cause daytime sleepiness so I recommend that you take 1 tablet daily at bedtime.     Atrovent (ipratropium): This is an excellent nasal decongestant spray I have added to your recommended nasal steroid that will not cause rebound congestion, please instill 2 sprays into each nare with each use.  Because nasal steroids can take several days before they begin to provide full benefit, I recommend that you use this spray in addition to the nasal steroid prescribed for you.  Please use it after you have used your nasal steroid and repeat up to 4 times daily as needed.  I have provided you with a prescription for this medication.      ProAir, Ventolin, Proventil (albuterol): This inhaled medication contains a short acting beta agonist bronchodilator.  This medication works on the smooth muscle that opens and constricts of your airways by relaxing the muscle.  The result of relaxation of the smooth muscle is increased air movement and improved work of breathing.  This is a short acting medication that can be used every 4-6 hours as needed for increased work of breathing, shortness of breath, wheezing and excessive coughing.  I have provided you with a prescription.    Trelegy (fluticasone, vilanterol and umeclidinium):  This inhaled  medication contains a corticosteroid and long-acting form of albuterol.  The inhaled steroid and this medication  is not absorbed into the body and will not cause side effects such as increased blood sugar levels, irritability, sleeplessness or weight gain.  Inhaled corticosteroid are sort of like topical steroid creams but, as you can imagine, it is not practical to attempt to rub a steroid cream inside of your lungs.  The long-acting albuterol works similarly to the short acting albuterol found in your rescue inhaler but provides 24-hour relaxation of the smooth muscles that open and constrict your airways; your short acting rescue inhaler can only provide for a few hours this benefit for a few hours.  The third unique ingredient, umeclidinium, is an antimuscarinic and works similarly to your albuterol, providing long-acting relaxation of the smooth muscles in your airway.  Please feel free to continue using your short acting rescue inhaler as often as needed throughout the day for shortness of breath, wheezing, and cough.  Advil, Motrin (ibuprofen): This is a good anti-inflammatory medication which addresses aches, pains and inflammation of the upper airways that causes sinus and nasal congestion as well as in the lower airways which makes your cough feel tight and sometimes burn.  I recommend that you take between 400 to 600 mg every 6-8 hours as needed.      Tylenol (acetaminophen): This is a good fever reducer.  If your body temperature rises above 101.5 as measured with a thermometer, it is recommended that you take 1,000 mg every 8 hours until your temperature falls below 101.5, please not take more than 3,000 mg  of acetaminophen either as a separate medication or as in ingredient in an over-the-counter cold/flu preparation within a 24-hour period.      Robitussin, Mucinex (guaifenesin): This is an expectorant.  This helps break up chest congestion and loosen up thick nasal drainage making phlegm and  drainage more liquid and therefore easier to remove.  I recommend being 400 mg three times daily as needed.      Promethazine DM: Promethazine is both a nasal decongestant and an antinausea medication that makes most patients feel fairly sleepy.  The DM is dextromethorphan, a cough suppressant found in many over-the-counter cough medications.  Please take 5 mL before bedtime to minimize your cough which will help you sleep better.  I have sent a prescription for this medication to your pharmacy.   Z-Pak (azithromycin): After 7 days of viral infection in the lungs, it is is very common for bacteria infection to set in and cause pneumonia.  For this reason, I have prescribed this antibiotic for you.  In the next 2-3 days, if you find that you are not making significant progress with the above medications, please pick up and begin this medication by taking 2 tablets the first day, then take 1 tablet daily every day thereafter until complete.  Please follow-up within the next 5-7 days either with your primary care provider or urgent care if your symptoms do not resolve.  If you do not have a primary care provider, we will assist you in finding one.  I have provided you with a note to be out of work for the next 3 days.  If you find that you need to be out a little bit longer, please reach out to the [facility] website to schedule a telehealth visit where you can discuss extending your work note.        Thank you for visiting urgent care today.  We appreciate the opportunity to participate in your care.

## 2022-02-11 ENCOUNTER — Telehealth: Payer: Self-pay | Admitting: Emergency Medicine

## 2022-02-11 NOTE — Telephone Encounter (Signed)
Contacted patient regarding her visit yesterday and she stated that she's in ICU due to her oxygen not staying up.  Advised patient to try to get some rest and if she needs anything to let us know.

## 2022-02-24 DEATH — deceased
